# Patient Record
Sex: Female | Born: 1935 | Race: Black or African American | Hispanic: No | Smoking: Former smoker
Health system: Southern US, Community
[De-identification: ages and names within clinical notes are randomized; demographics above are authoritative.]

## PROBLEM LIST (undated history)

## (undated) ENCOUNTER — Emergency Department (HOSPITAL_BASED_OUTPATIENT_CLINIC_OR_DEPARTMENT_OTHER): Admission: EM | Payer: Medicare HMO | Source: Home / Self Care

## (undated) DIAGNOSIS — F0390 Unspecified dementia without behavioral disturbance: Secondary | ICD-10-CM

## (undated) DIAGNOSIS — E7800 Pure hypercholesterolemia, unspecified: Secondary | ICD-10-CM

## (undated) DIAGNOSIS — I2510 Atherosclerotic heart disease of native coronary artery without angina pectoris: Secondary | ICD-10-CM

## (undated) DIAGNOSIS — J40 Bronchitis, not specified as acute or chronic: Secondary | ICD-10-CM

## (undated) DIAGNOSIS — E785 Hyperlipidemia, unspecified: Secondary | ICD-10-CM

## (undated) DIAGNOSIS — I10 Essential (primary) hypertension: Secondary | ICD-10-CM

## (undated) DIAGNOSIS — M1990 Unspecified osteoarthritis, unspecified site: Secondary | ICD-10-CM

## (undated) DIAGNOSIS — K219 Gastro-esophageal reflux disease without esophagitis: Secondary | ICD-10-CM

## (undated) HISTORY — PX: ABDOMINAL HYSTERECTOMY: SHX81

## (undated) HISTORY — PX: VAGINAL HYSTERECTOMY: SHX2639

---

## 2008-06-14 ENCOUNTER — Emergency Department (HOSPITAL_BASED_OUTPATIENT_CLINIC_OR_DEPARTMENT_OTHER): Admission: EM | Admit: 2008-06-14 | Discharge: 2008-06-14 | Payer: Self-pay | Admitting: Emergency Medicine

## 2009-03-17 ENCOUNTER — Emergency Department (HOSPITAL_BASED_OUTPATIENT_CLINIC_OR_DEPARTMENT_OTHER): Admission: EM | Admit: 2009-03-17 | Discharge: 2009-03-17 | Payer: Self-pay | Admitting: Emergency Medicine

## 2009-03-17 ENCOUNTER — Ambulatory Visit: Payer: Self-pay | Admitting: Diagnostic Radiology

## 2011-06-30 ENCOUNTER — Emergency Department (HOSPITAL_BASED_OUTPATIENT_CLINIC_OR_DEPARTMENT_OTHER)
Admission: EM | Admit: 2011-06-30 | Discharge: 2011-07-01 | Disposition: A | Discharge status: Home / Self Care | Payer: No Typology Code available for payment source | Attending: Emergency Medicine | Admitting: Emergency Medicine

## 2011-06-30 ENCOUNTER — Encounter (HOSPITAL_BASED_OUTPATIENT_CLINIC_OR_DEPARTMENT_OTHER): Payer: Self-pay

## 2011-06-30 DIAGNOSIS — E785 Hyperlipidemia, unspecified: Secondary | ICD-10-CM | POA: Insufficient documentation

## 2011-06-30 DIAGNOSIS — K219 Gastro-esophageal reflux disease without esophagitis: Secondary | ICD-10-CM | POA: Insufficient documentation

## 2011-06-30 DIAGNOSIS — I10 Essential (primary) hypertension: Secondary | ICD-10-CM | POA: Insufficient documentation

## 2011-06-30 DIAGNOSIS — I2510 Atherosclerotic heart disease of native coronary artery without angina pectoris: Secondary | ICD-10-CM | POA: Insufficient documentation

## 2011-06-30 HISTORY — DX: Bronchitis, not specified as acute or chronic: J40

## 2011-06-30 HISTORY — DX: Gastro-esophageal reflux disease without esophagitis: K21.9

## 2011-06-30 HISTORY — DX: Essential (primary) hypertension: I10

## 2011-06-30 HISTORY — DX: Atherosclerotic heart disease of native coronary artery without angina pectoris: I25.10

## 2011-06-30 HISTORY — DX: Hyperlipidemia, unspecified: E78.5

## 2011-06-30 NOTE — ED Notes (Signed)
Pt states that she has been experiencing high blood pressure readings at home.  Pt states that she had a catheterization on her heart about a week ago.  No other symptoms just blood pressure check.

## 2011-07-01 NOTE — ED Provider Notes (Signed)
History    Scribed for Hanley Seamen, MD, the patient was seen in room MH01/MH01. This chart was scribed by Katha Cabal.   CSN: [id number]  Arrival date & time 06/30/11  2104   First MD Initiated Contact with Patient 07/01/11 0045      Chief Complaint  Patient presents with  . Hypertension    (Consider location/radiation/quality/duration/timing/severity/associated sxs/prior treatment) HPI   Perline Percifield is a 78 y.o. female with hypertension presents to the Emergency Department complaining persistent of elevated blood pressure since after heart cath.  Patient takes Lisinopril and was placed on Lasix today by PMD.  Episode is gradually improving.  BP at home was 207/92.  Patient had heart catheterization about a week ago.  There has been no difficulty breathing or urinary retention.     Past Medical History  Diagnosis Date  . Hypertension   . Hyperlipidemia   . Acid reflux   . Bronchitis   . Coronary artery disease     Past Surgical History  Procedure Date  . Abdominal hysterectomy   . Cardiac catheterization     History reviewed. No pertinent family history.  History  Substance Use Topics  . Smoking status: Never Smoker   . Smokeless tobacco: Never Used  . Alcohol Use: No    OB History    Grav Para Term Preterm Abortions TAB SAB Ect Mult Living                  Review of Systems  All other systems reviewed and are negative.    Allergies  Review of patient's allergies indicates no known allergies.  Home Medications  No current outpatient prescriptions on file.  BP 156/83  Pulse 58  Temp(Src) 97.9 F (36.6 C) (Oral)  Resp 20  Ht 5\' 4"  (1.626 m)  Wt 141 lb (63.957 kg)  BMI 24.20 kg/m2  SpO2 99%  Physical Exam  General: Well-developed, well-nourished female in no acute distress; appearance consistent with age of record HENT: normocephalic, atraumatic Eyes: pupils reactive to light; extraocular muscles intact, Irregular left pupil with lens  implant, archen senalis , Neck: supple Heart: regular rate and rhythm; no rubs or gallops,   systolic murmur (2/6) at right upper sternum border Lungs: clear to auscultation bilaterally Abdomen: soft; nondistended; nontender; no masses or hepatosplenomegaly; bowel sounds present Extremities: No deformity; full range of motion; pulses normal, good pulses, no edema Neurologic: Awake, alert and oriented; motor function intact in all extremities and symmetric; no facial droop Skin: Warm and dry Psychiatric: Normal mood and affect     ED Course  Procedures (including critical care time)   DIAGNOSTIC STUDIES: Oxygen Saturation is 99% on room air, normal by my interpretation.     COORDINATION OF CARE: 12:52 AM  Physical exam complete.  Reviewed vital signs.  BP 156/83.     LABS / RADIOLOGY:   Labs Reviewed - No data to display No results found.       MDM  I personally performed the services described in this documentation, which was scribed in my presence.  The recorded information has been reviewed and considered.  Patient reassured. Advised that worrying about her blood pressure is known to cause blood pressure to increase. She should take her blood pressure regularly and long it but not dwell on it. She was advised that should she become symptomatic, such as chest pain, dyspnea or neurologic changes she should seek evaluation.    IMPRESSION: 1. Hypertension  Carlisle Beers Murlin Schrieber, MD 07/01/11 0100

## 2011-07-27 ENCOUNTER — Encounter (INDEPENDENT_AMBULATORY_CARE_PROVIDER_SITE_OTHER): Payer: Medicare HMO

## 2011-07-27 DIAGNOSIS — R030 Elevated blood-pressure reading, without diagnosis of hypertension: Secondary | ICD-10-CM

## 2011-09-28 ENCOUNTER — Emergency Department (INDEPENDENT_AMBULATORY_CARE_PROVIDER_SITE_OTHER): Payer: Medicare HMO

## 2011-09-28 ENCOUNTER — Encounter (HOSPITAL_BASED_OUTPATIENT_CLINIC_OR_DEPARTMENT_OTHER): Payer: Self-pay | Admitting: *Deleted

## 2011-09-28 ENCOUNTER — Emergency Department (HOSPITAL_BASED_OUTPATIENT_CLINIC_OR_DEPARTMENT_OTHER)
Admission: EM | Admit: 2011-09-28 | Discharge: 2011-09-29 | Disposition: A | Discharge status: Home / Self Care | Payer: Medicare HMO | Attending: Emergency Medicine | Admitting: Emergency Medicine

## 2011-09-28 DIAGNOSIS — M19079 Primary osteoarthritis, unspecified ankle and foot: Secondary | ICD-10-CM

## 2011-09-28 DIAGNOSIS — M79609 Pain in unspecified limb: Secondary | ICD-10-CM

## 2011-09-28 DIAGNOSIS — S93409A Sprain of unspecified ligament of unspecified ankle, initial encounter: Secondary | ICD-10-CM

## 2011-09-28 DIAGNOSIS — M129 Arthropathy, unspecified: Secondary | ICD-10-CM | POA: Insufficient documentation

## 2011-09-28 DIAGNOSIS — W19XXXA Unspecified fall, initial encounter: Secondary | ICD-10-CM

## 2011-09-28 DIAGNOSIS — M25579 Pain in unspecified ankle and joints of unspecified foot: Secondary | ICD-10-CM

## 2011-09-28 DIAGNOSIS — I10 Essential (primary) hypertension: Secondary | ICD-10-CM | POA: Insufficient documentation

## 2011-09-28 DIAGNOSIS — S92919A Unspecified fracture of unspecified toe(s), initial encounter for closed fracture: Secondary | ICD-10-CM

## 2011-09-28 DIAGNOSIS — W108XXA Fall (on) (from) other stairs and steps, initial encounter: Secondary | ICD-10-CM | POA: Insufficient documentation

## 2011-09-28 DIAGNOSIS — T148XXA Other injury of unspecified body region, initial encounter: Secondary | ICD-10-CM

## 2011-09-28 DIAGNOSIS — E7800 Pure hypercholesterolemia, unspecified: Secondary | ICD-10-CM | POA: Insufficient documentation

## 2011-09-28 DIAGNOSIS — Z79899 Other long term (current) drug therapy: Secondary | ICD-10-CM | POA: Insufficient documentation

## 2011-09-28 HISTORY — DX: Pure hypercholesterolemia, unspecified: E78.00

## 2011-09-28 HISTORY — DX: Unspecified osteoarthritis, unspecified site: M19.90

## 2011-09-28 MED ORDER — OXYCODONE-ACETAMINOPHEN 5-325 MG PO TABS
1.0000 | ORAL_TABLET | Freq: Once | ORAL | Status: AC
  Administered 2011-09-28: 1 via ORAL
  Filled 2011-09-28: qty 1

## 2011-09-28 NOTE — ED Notes (Signed)
Pt playing with her 5yr old granddaughter and fell down 7 stairs. Pt denies LOC or hitting her head. Pt complaining of bilateral ankle and bilateral foot pain.

## 2011-09-28 NOTE — ED Provider Notes (Addendum)
History     CSN: [id number]  Arrival date & time 09/28/11  2204   First MD Initiated Contact with Patient 09/28/11 2209      Chief Complaint  Patient presents with  . Fall    (Consider location/radiation/quality/duration/timing/severity/associated sxs/prior treatment) Patient is a 78 y.o. female presenting with fall.  Fall   patient fell approximately one hour prior to admission down 7 stairs. She is complaining of pain in her left ankle and foot and her right fourth toe. She has an abrasion to the palm of her left hand. She denies striking her head or having any other injuries. She denies any loss of consciousness. She is not on any blood thinners. The pain is sharp and severe. It increases with walking. There is no radiation. Has been present constantly since the fall.  Past Medical History  Diagnosis Date  . Hypertension   . Hypercholesteremia   . Arthritis     Past Surgical History  Procedure Date  . Vaginal hysterectomy     History reviewed. No pertinent family history.  History  Substance Use Topics  . Smoking status: Former Smoker -- 0.2 packs/day for 1 years    Types: Cigarettes    Quit date: 09/27/1961  . Smokeless tobacco: Never Used  . Alcohol Use: No    OB History    Grav Para Term Preterm Abortions TAB SAB Ect Mult Living   4 4 4              Review of Systems  All other systems reviewed and are negative.    Allergies  Ivp dye  Home Medications   Current Outpatient Rx  Name Route Sig Dispense Refill  . AMLODIPINE BESYLATE 10 MG PO TABS Oral Take 10 mg by mouth daily.    . ASPIRIN 81 MG PO TABS Oral Take 81 mg by mouth daily.    Marland Kitchen LIPITOR PO Oral Take 1 tablet by mouth at bedtime.    . FUROSEMIDE 20 MG PO TABS Oral Take 20 mg by mouth 2 (two) times daily.    Marland Kitchen HYDRALAZINE HCL 10 MG PO TABS Oral Take 10 mg by mouth 3 (three) times daily.    Marland Kitchen LISINOPRIL 40 MG PO TABS Oral Take 40 mg by mouth daily.      BP 128/55  Pulse 80  Temp(Src)  97.9 F (36.6 C) (Oral)  Resp 16  Ht 5\' 5"  (1.651 m)  Wt 150 lb (68.04 kg)  BMI 24.96 kg/m2  SpO2 96%  Physical Exam  Nursing note and vitals reviewed. Constitutional: She appears well-developed and well-nourished.  HENT:  Head: Normocephalic and atraumatic.  Eyes: Conjunctivae and EOM are normal. Pupils are equal, round, and reactive to light.  Neck: Normal range of motion. Neck supple.  Cardiovascular: Normal rate and regular rhythm.   Pulmonary/Chest: Effort normal.  Abdominal: Soft. Bowel sounds are normal.  Genitourinary: Guaiac stool: right fourth toe swollen and diffusely tender. No open skin noted.  Musculoskeletal:       Remainder of fluid on right is normal. Left foot is tender over the base of the fifth metatarsal. Left ankle is tender laterally. There is full range of motion of both knees and hips. There is no tenderness palpated over the entire spine. Upper extremities appear normal with the exception of an abrasion to the base of the left palm.  Neurological: She is alert.  Skin: Skin is warm and dry.  Psychiatric: She has a normal mood and affect.  ED Course  Procedures (including critical care time)  Labs Reviewed - No data to display Dg Ankle Complete Left  09/28/2011  *RADIOLOGY REPORT*  Clinical Data: Bilateral foot and ankle pain after fall  LEFT ANKLE COMPLETE - 3+ VIEW  Comparison: None.  Findings: No evidence of acute fracture or subluxation of the left ankle.  The ankle mortis and talar dome appear intact.  No focal bone lesion or bone destruction.  Bone cortex and trabecular architecture appear intact.  No abnormal periosteal reaction. Vascular calcifications.  Degenerative changes in the tarsal bones. Small Achilles calcaneal spur.  IMPRESSION: No acute bony abnormalities.  Original Report Authenticated By: Marlon Pel, M.D.   Dg Foot Complete Left  09/28/2011  *RADIOLOGY REPORT*  Clinical Data: Fall.  Pain.  LEFT FOOT - COMPLETE 3+ VIEW   Comparison: None.  Findings: No acute bony or joint abnormality is identified.  First MTP degenerative change is noted.  Soft tissue structures are unremarkable.  IMPRESSION: No acute finding.  First MTP osteoarthritis.  Original Report Authenticated By: Bernadene Bell. D'ALESSIO, M.D.   Dg Foot Complete Right  09/28/2011  *RADIOLOGY REPORT*  Clinical Data: Bilateral foot and ankle pain after fall  RIGHT FOOT COMPLETE - 3+ VIEW  Comparison: None.  Findings: Acute transverse fracture of the proximal shaft of the proximal phalanx of the right fourth toe.  Mild medial displacement and lateral angulation of the distal fracture fragment.  No other acute fractures visualized.  Degenerative changes in the first metatarsal-phalangeal joint.  No focal bone lesion or bone destruction.  No abnormal periosteal reaction.  Vascular calcifications.  IMPRESSION: Acute fracture of the proximal phalanx of the right fourth toe. Degenerative changes.  Original Report Authenticated By: Marlon Pel, M.D.     No diagnosis found.   MDM  Plan Buddy taping of right fourth toe with third toe or fifth toe. She will have a post op shoe placed on this side. She will have her device on left ankle. She'll be placed on crutches to put weight onto the right foot. She is given pain medicine. She is advised to follow with her primary care Dr.     Roosvelt Harps rx for walker- discussed with patient and daughter.   Hilario Quarry, MD 09/28/11 0454  Hilario Quarry, MD 09/28/11 [phone]

## 2011-09-28 NOTE — ED Notes (Signed)
Dr Ray at bedside. 

## 2011-09-28 NOTE — Discharge Instructions (Signed)
Ankle Sprain An ankle sprain is an injury to the strong, fibrous tissues (ligaments) that hold the bones of your ankle joint together.  CAUSES Ankle sprain usually is caused by a fall or by twisting your ankle. People who participate in sports are more prone to these types of injuries.  SYMPTOMS  Symptoms of ankle sprain include:  Pain in your ankle. The pain may be present at rest or only when you are trying to stand or walk.   Swelling.   Bruising. Bruising may develop immediately or within 1 to 2 days after your injury.   Difficulty standing or walking.  DIAGNOSIS  Your caregiver will ask you details about your injury and perform a physical exam of your ankle to determine if you have an ankle sprain. During the physical exam, your caregiver will press and squeeze specific areas of your foot and ankle. Your caregiver will try to move your ankle in certain ways. An X-Sagal Gayton exam may be done to be sure a bone was not broken or a ligament did not separate from one of the bones in your ankle (avulsion).  TREATMENT  Certain types of braces can help stabilize your ankle. Your caregiver can make a recommendation for this. Your caregiver may recommend the use of medication for pain. If your sprain is severe, your caregiver may refer you to a surgeon who helps to restore function to parts of your skeletal system (orthopedist) or a physical therapist. HOME CARE INSTRUCTIONS  Apply ice to your injury for 1 to 2 days or as directed by your caregiver. Applying ice helps to reduce inflammation and pain.  Put ice in a plastic bag.   Place a towel between your skin and the bag.   Leave the ice on for 15 to 20 minutes at a time, every 2 hours while you are awake.   Take over-the-counter or prescription medicines for pain, discomfort, or fever only as directed by your caregiver.   Keep your injured leg elevated, when possible, to lessen swelling.   If your caregiver recommends crutches, use them as  instructed. Gradually, put weight on the affected ankle. Continue to use crutches or a cane until you can walk without feeling pain in your ankle.   If you have a plaster splint, wear the splint as directed by your caregiver. Do not rest it on anything harder than a pillow the first 24 hours. Do not put weight on it. Do not get it wet. You may take it off to take a shower or bath.   You may have been given an elastic bandage to wear around your ankle to provide support. If the elastic bandage is too tight (you have numbness or tingling in your foot or your foot becomes cold and blue), adjust the bandage to make it comfortable.   If you have an air splint, you may blow more air into it or let air out to make it more comfortable. You may take your splint off at night and before taking a shower or bath.   Wiggle your toes in the splint several times per day if you are able.  SEEK MEDICAL CARE IF:   You have an increase in bruising, swelling, or pain.   Your toes feel cold.   Pain relief is not achieved with medication.  SEEK IMMEDIATE MEDICAL CARE IF: Your toes are numb or blue or you have severe pain. MAKE SURE YOU:   Understand these instructions.   Will watch your condition.     Will get help right away if you are not doing well or get worse.  Document Released: 05/07/2005 Document Revised: 04/26/2011 Document Reviewed: 12/10/2007 [facility] Patient Information 2012 [place], [place].Toe Fracture Your caregiver has diagnosed you as having a fractured toe. A toe fracture is a break in the bone of a toe. "Buddy taping" is a way of splinting your broken toe, by taping the broken toe to the toe next to it. This "buddy taping" will keep the injured toe from moving beyond normal range of motion. Buddy taping also helps the toe heal in a more normal alignment. It may take 6 to 8 weeks for the toe injury to heal. HOME CARE INSTRUCTIONS   Leave your toes taped together for as long as directed by your  caregiver or until you see a doctor for a follow-up examination. You can change the tape after bathing. Always use a small piece of gauze or cotton between the toes when taping them together. This will help the skin stay dry and prevent infection.   Apply ice to the injury for 15 to 20 minutes each hour while awake for the first 2 days. Put the ice in a plastic bag and place a towel between the bag of ice and your skin.   After the first 2 days, apply heat to the injured area. Use heat for the next 2 to 3 days. Place a heating pad on the foot or soak the foot in warm water as directed by your caregiver.   Keep your foot elevated as much as possible to lessen swelling.   Wear sturdy, supportive shoes. The shoes should not pinch the toes or fit tightly against the toes.   Your caregiver may prescribe a rigid shoe if your foot is very swollen.   Your may be given crutches if the pain is too great and it hurts too much to walk.   Only take over-the-counter or prescription medicines for pain, discomfort, or fever as directed by your caregiver.   If your caregiver has given you a follow-up appointment, it is very important to keep that appointment. Not keeping the appointment could result in a chronic or permanent injury, pain, and disability. If there is any problem keeping the appointment, you must call back to this facility for assistance.  SEEK MEDICAL CARE IF:   You have increased pain or swelling, not relieved with medications.   The pain does not get better after 1 week.   Your injured toe is cold when the others are warm.  SEEK IMMEDIATE MEDICAL CARE IF:   The toe becomes cold, numb, or white.   The toe becomes hot (inflamed) and red.  Document Released: 05/04/2000 Document Revised: 04/26/2011 Document Reviewed: 12/22/2007 [facility] [place] Patient Information 2012 [place], [place].

## 2012-01-18 ENCOUNTER — Encounter (HOSPITAL_BASED_OUTPATIENT_CLINIC_OR_DEPARTMENT_OTHER): Payer: Self-pay | Admitting: *Deleted

## 2012-01-18 ENCOUNTER — Emergency Department (HOSPITAL_BASED_OUTPATIENT_CLINIC_OR_DEPARTMENT_OTHER): Payer: Medicare HMO

## 2012-01-18 ENCOUNTER — Emergency Department (HOSPITAL_BASED_OUTPATIENT_CLINIC_OR_DEPARTMENT_OTHER)
Admission: EM | Admit: 2012-01-18 | Discharge: 2012-01-18 | Disposition: A | Discharge status: Home / Self Care | Payer: Medicare HMO | Attending: Emergency Medicine | Admitting: Emergency Medicine

## 2012-01-18 DIAGNOSIS — S838X9A Sprain of other specified parts of unspecified knee, initial encounter: Secondary | ICD-10-CM | POA: Insufficient documentation

## 2012-01-18 DIAGNOSIS — Z7982 Long term (current) use of aspirin: Secondary | ICD-10-CM | POA: Insufficient documentation

## 2012-01-18 DIAGNOSIS — Z86718 Personal history of other venous thrombosis and embolism: Secondary | ICD-10-CM | POA: Insufficient documentation

## 2012-01-18 DIAGNOSIS — S86819A Strain of other muscle(s) and tendon(s) at lower leg level, unspecified leg, initial encounter: Secondary | ICD-10-CM | POA: Insufficient documentation

## 2012-01-18 DIAGNOSIS — Z87891 Personal history of nicotine dependence: Secondary | ICD-10-CM | POA: Insufficient documentation

## 2012-01-18 DIAGNOSIS — M129 Arthropathy, unspecified: Secondary | ICD-10-CM | POA: Insufficient documentation

## 2012-01-18 DIAGNOSIS — W108XXA Fall (on) (from) other stairs and steps, initial encounter: Secondary | ICD-10-CM | POA: Insufficient documentation

## 2012-01-18 DIAGNOSIS — E7800 Pure hypercholesterolemia, unspecified: Secondary | ICD-10-CM | POA: Insufficient documentation

## 2012-01-18 DIAGNOSIS — S86119A Strain of other muscle(s) and tendon(s) of posterior muscle group at lower leg level, unspecified leg, initial encounter: Secondary | ICD-10-CM

## 2012-01-18 DIAGNOSIS — I10 Essential (primary) hypertension: Secondary | ICD-10-CM | POA: Insufficient documentation

## 2012-01-18 NOTE — ED Notes (Signed)
Pain in her left lower leg for a few months. No known injury. No swelling or redness noted. Ambulatory without difficulty.

## 2012-01-18 NOTE — ED Provider Notes (Signed)
History     CSN: [id number]  Arrival date & time 01/18/12  1422   First MD Initiated Contact with Patient 01/18/12 1454      Chief Complaint  Patient presents with  . Leg Pain    (Consider location/radiation/quality/duration/timing/severity/associated sxs/prior treatment) HPI Patient is a 78 year old female who presents today complaining of left calf pain that she rates as being mild and aching that is single and on for the past month. Apparently a month ago the patient had a fall down 5 or 6 steps and at that time had a broken right toe as well as the left ankle sprain. She denies any other trauma and said that since that time she's developed discomfort in her left calf. This is isolated not bilateral. Patient has seen her primary care physician for this he feels that it is a muscle pull. Patient does note that she has stopped wearing shoes with 1-2 inch heels and has begun wearing flat shoes. She got this may be the reason for the pain but admits that it is only in the left leg. She does have a history of blood clots and is on aspirin but no other anticoagulation. Patient has no history of cancer. She does not know the reason for her previous blood clot. Pain is worse with ambulation better with the tramadol she has been prescribed. There no other associated modifying factors. Past Medical History  Diagnosis Date  . Hypertension   . Hypercholesteremia   . Arthritis     Past Surgical History  Procedure Date  . Vaginal hysterectomy     History reviewed. No pertinent family history.  History  Substance Use Topics  . Smoking status: Former Smoker -- 0.2 packs/day for 1 years    Types: Cigarettes    Quit date: 09/27/1961  . Smokeless tobacco: Never Used  . Alcohol Use: No    OB History    Grav Para Term Preterm Abortions TAB SAB Ect Mult Living   4 4 4              Review of Systems  Constitutional: Negative.   HENT: Negative.   Eyes: Negative.   Respiratory: Negative.     Cardiovascular: Negative.   Gastrointestinal: Negative.   Genitourinary: Negative.   Musculoskeletal:       Left leg pain  Skin: Negative.   Neurological: Negative.   Hematological: Negative.   Psychiatric/Behavioral: Negative.   All other systems reviewed and are negative.    Allergies  Ivp dye  Home Medications   Current Outpatient Rx  Name Route Sig Dispense Refill  . AMLODIPINE BESYLATE 10 MG PO TABS Oral Take 10 mg by mouth daily.    . ASPIRIN 81 MG PO TABS Oral Take 81 mg by mouth daily.    . ATORVASTATIN CALCIUM 20 MG PO TABS Oral Take 20 mg by mouth daily.    . FUROSEMIDE 20 MG PO TABS Oral Take 20 mg by mouth 2 (two) times daily.    Marland Kitchen HYDRALAZINE HCL 10 MG PO TABS Oral Take 10 mg by mouth 3 (three) times daily.    Marland Kitchen LISINOPRIL 40 MG PO TABS Oral Take 40 mg by mouth daily.      BP 132/75  Pulse 63  Temp 98.6 F (37 C) (Oral)  Resp 20  SpO2 99%  Physical Exam  Nursing note and vitals reviewed. GEN: Well-developed, well-nourished female in no distress HEENT: Atraumatic, normocephalic. Oropharynx clear without erythema EYES: PERRLA BL, no scleral icterus. NECK:  Trachea midline, no meningismus CV: regular rate and rhythm. No murmurs, rubs, or gallops PULM: No respiratory distress.  No crackles, wheezes, or rales. GI: soft, non-tender. No guarding, rebound, or tenderness. + bowel sounds  GU: deferred Neuro: cranial nerves grossly 2-12 intact, no abnormalities of strength or sensation, A and O x 3 MSK: Patient moves all 4 extremities symmetrically, no deformity, edema, or injury noted. Patient with tenderness to palpation of the left calf no palpable cord. Pulse is intact distally. Skin: No rashes petechiae, purpura, or jaundice Psych: no abnormality of mood   ED Course  Procedures (including critical care time)  Labs Reviewed - No data to display US Venous Img Lower Unilateral Left  01/18/2012  *RADIOLOGY REPORT*  Clinical Data: Left calf pain.  Fall 2  months ago.  History of pulmonary embolus.  LEFT LOWER EXTREMITY VENOUS DUPLEX ULTRASOUND  Technique: Gray-scale sonography with graded compression, as well as color Doppler and duplex ultrasound, were performed to evaluate the deep venous system of the lower extremity on the left side from the level of the common femoral vein through the popliteal and proximal calf veins. Spectral Doppler was utilized to evaluate flow at rest and with distal augmentation maneuvers.  Comparison: None  Findings:  Normal compressibility of the left common femoral, superficial femoral, and popliteal veins is demonstrated, as well as the visualized proximal calf veins.  No filling defects to suggest DVT on grayscale or color Doppler imaging.  Doppler waveforms show normal direction of venous flow, normal respiratory phasicity and response to augmentation.  IMPRESSION: 1.  No evidence of lower extremity deep vein thrombosis.   Original Report Authenticated By: Dellia Cloud, M.D.      1. Gastrocnemius strain       MDM  Patient was evaluated by myself. Based on evaluation I have low suspicion for blood clot. However, patient had history of prior thromboembolic event and is not currently anticoagulated. Patient and I discussed this and she preferred to have ultrasound today. This was ordered. If this returns negative I do suspect the patient may have slight muscle pull. She is comfortable with her current pain control with tramadol and admits that she was here today as she was worried there might be something worse going on that was being missed by assuming that this was a muscle pull.  4:26 PM Ultrasound returns negative. Patient was notified. She can continue to take her home tramadol and followup as previously scheduled with her primary care physician. She was discharged in good condition.        Cyndra Numbers, MD 01/18/12 [phone]

## 2012-08-23 ENCOUNTER — Encounter (HOSPITAL_BASED_OUTPATIENT_CLINIC_OR_DEPARTMENT_OTHER): Payer: Self-pay | Admitting: *Deleted

## 2012-08-23 ENCOUNTER — Emergency Department (HOSPITAL_BASED_OUTPATIENT_CLINIC_OR_DEPARTMENT_OTHER)
Admission: EM | Admit: 2012-08-23 | Discharge: 2012-08-23 | Disposition: A | Discharge status: Home / Self Care | Payer: Medicare HMO | Attending: Emergency Medicine | Admitting: Emergency Medicine

## 2012-08-23 DIAGNOSIS — M129 Arthropathy, unspecified: Secondary | ICD-10-CM | POA: Insufficient documentation

## 2012-08-23 DIAGNOSIS — R0600 Dyspnea, unspecified: Secondary | ICD-10-CM

## 2012-08-23 DIAGNOSIS — Z87891 Personal history of nicotine dependence: Secondary | ICD-10-CM | POA: Insufficient documentation

## 2012-08-23 DIAGNOSIS — R0609 Other forms of dyspnea: Secondary | ICD-10-CM | POA: Insufficient documentation

## 2012-08-23 DIAGNOSIS — R0989 Other specified symptoms and signs involving the circulatory and respiratory systems: Secondary | ICD-10-CM | POA: Insufficient documentation

## 2012-08-23 DIAGNOSIS — Z7982 Long term (current) use of aspirin: Secondary | ICD-10-CM | POA: Insufficient documentation

## 2012-08-23 DIAGNOSIS — E7800 Pure hypercholesterolemia, unspecified: Secondary | ICD-10-CM | POA: Insufficient documentation

## 2012-08-23 DIAGNOSIS — Z79899 Other long term (current) drug therapy: Secondary | ICD-10-CM | POA: Insufficient documentation

## 2012-08-23 DIAGNOSIS — I10 Essential (primary) hypertension: Secondary | ICD-10-CM | POA: Insufficient documentation

## 2012-08-23 DIAGNOSIS — Z8709 Personal history of other diseases of the respiratory system: Secondary | ICD-10-CM | POA: Insufficient documentation

## 2012-08-23 DIAGNOSIS — R002 Palpitations: Secondary | ICD-10-CM | POA: Insufficient documentation

## 2012-08-23 MED ORDER — LEVALBUTEROL TARTRATE 45 MCG/ACT IN AERO: 1.0000 | INHALATION_SPRAY | RESPIRATORY_TRACT | Status: DC | PRN

## 2012-08-23 NOTE — ED Notes (Signed)
Pt sattes she saw the Dr. On Friday and was dx'd with bronchitis. Has been sick x 2 weeks. Sarah, RRT to Triage to assess.

## 2012-08-23 NOTE — ED Provider Notes (Signed)
History    This chart was scribed for Krista B. Bernette Mayers, MD scribed by Magnus Sinning. The patient was seen in room MH12/MH12 at 21:53   CSN: [id number]  Arrival date & time 08/23/12  2038     Chief Complaint  Patient presents with  . Shortness of Breath    (Consider location/radiation/quality/duration/timing/severity/associated sxs/prior treatment) Patient is a 79 y.o. female presenting with shortness of breath. The history is provided by the patient. No language interpreter was used.  Shortness of Breath Associated symptoms: no chest pain, no cough and no fever    Krista Joseph is a 79 y.o. female who presents to the Emergency Department complaining of constant moderate SOB, onset one month ago, but worsening all day today. She says she went to her doctor a week ago and again two days ago. She says at recent visit she was rechecked for SOB and informed to stop taking previously prescribed QVAR inhaler due to heart palpitations. She says the SOB has improved since initial onset, two weeks ago and reports it only worsens during activity. She says at first visit with PCP they suspected she had a bronchitis, and was given this QVAR inhaler. Despite instructions to stop QVAR, she says she has used it today with minimal temporary relief.  She denies coughing or worsening SOB when laying down in bed at night.  The patient states that that she has never been seen by a lung specialist.   Past Medical History  Diagnosis Date  . Hypertension   . Hypercholesteremia   . Arthritis   . Bronchitis     Past Surgical History  Procedure Laterality Date  . Vaginal hysterectomy      History reviewed. No pertinent family history.  History  Substance Use Topics  . Smoking status: Former Smoker -- 0.25 packs/day for 1 years    Types: Cigarettes    Quit date: 09/27/1961  . Smokeless tobacco: Never Used  . Alcohol Use: No    OB History   Grav Para Term Preterm Abortions TAB SAB Ect Mult  Living   4 4 4              Review of Systems  Constitutional: Negative for fever.  Respiratory: Positive for shortness of breath. Negative for cough.   Cardiovascular: Positive for palpitations. Negative for chest pain and leg swelling.  All other systems reviewed and are negative.    Allergies  Ivp dye  Home Medications   Current Outpatient Rx  Name  Route  Sig  Dispense  Refill  . amLODipine (NORVASC) 10 MG tablet   Oral   Take 10 mg by mouth daily.         Marland Kitchen aspirin 81 MG tablet   Oral   Take 81 mg by mouth daily.         Marland Kitchen atorvastatin (LIPITOR) 20 MG tablet   Oral   Take 20 mg by mouth daily.         . furosemide (LASIX) 20 MG tablet   Oral   Take 20 mg by mouth 2 (two) times daily.         . hydrALAZINE (APRESOLINE) 10 MG tablet   Oral   Take 10 mg by mouth 3 (three) times daily.         Marland Kitchen lisinopril (PRINIVIL,ZESTRIL) 40 MG tablet   Oral   Take 40 mg by mouth daily.           BP 145/73  Pulse 66  Temp(Src) 98.6 F (37 C) (Oral)  Resp 20  Ht 5\' 4"  (1.626 m)  Wt 136 lb (61.689 kg)  BMI 23.33 kg/m2  SpO2 96%  Physical Exam  Nursing note and vitals reviewed. Constitutional: She is oriented to person, place, and time. She appears well-developed and well-nourished.  HENT:  Head: Normocephalic and atraumatic.  Eyes: EOM are normal. Pupils are equal, round, and reactive to light.  Neck: Normal range of motion. Neck supple.  Cardiovascular: Normal rate, normal heart sounds and intact distal pulses.   Pulmonary/Chest: Effort normal and breath sounds normal.  Abdominal: Bowel sounds are normal. She exhibits no distension. There is no tenderness.  Musculoskeletal: Normal range of motion. She exhibits no edema and no tenderness.  Neurological: She is alert and oriented to person, place, and time. She has normal strength. No cranial nerve deficit or sensory deficit.  Skin: Skin is warm and dry. No rash noted.  Psychiatric: She has a normal  mood and affect.    ED Course  Procedures (including critical care time) DIAGNOSTIC STUDIES: Oxygen Saturation is 96% on room air, normal by my interpretation.    COORDINATION OF CARE:  Labs Reviewed - No data to display No results found.   1. Dyspnea       MDM  Pt with history of bronchitis has had about a month of intermittent dyspnea, has had evaluation by cardiology (negative) and PCP who gave her QVAR which has helped her symptoms but causes her heart to race. She was having symptoms earlier today so came here for eval. Feeling fine now. No objective signs of dyspnea. Normal exam. Daughter at bedside states she has had extensive normal evaluation recently. No indication for further ED workup. Will give Rx for Xopanex inhaler as this may have less incidence of tachycardia although she was cautioned that this may not be true in all cases. Advised PCP follow up for recheck if not improving.   I personally performed the services described in this documentation, which was scribed in my presence. The recorded information has been reviewed and is accurate.          Krista B. Bernette Mayers, MD 08/23/12 2217

## 2012-08-23 NOTE — ED Notes (Signed)
MD at bedside. 

## 2012-10-20 ENCOUNTER — Emergency Department (HOSPITAL_BASED_OUTPATIENT_CLINIC_OR_DEPARTMENT_OTHER)
Admission: EM | Admit: 2012-10-20 | Discharge: 2012-10-20 | Disposition: A | Discharge status: Home / Self Care | Payer: Medicare HMO | Attending: Emergency Medicine | Admitting: Emergency Medicine

## 2012-10-20 ENCOUNTER — Emergency Department (HOSPITAL_BASED_OUTPATIENT_CLINIC_OR_DEPARTMENT_OTHER): Payer: Medicare HMO

## 2012-10-20 ENCOUNTER — Encounter (HOSPITAL_BASED_OUTPATIENT_CLINIC_OR_DEPARTMENT_OTHER): Payer: Self-pay | Admitting: Student

## 2012-10-20 DIAGNOSIS — I10 Essential (primary) hypertension: Secondary | ICD-10-CM | POA: Insufficient documentation

## 2012-10-20 DIAGNOSIS — E7800 Pure hypercholesterolemia, unspecified: Secondary | ICD-10-CM | POA: Insufficient documentation

## 2012-10-20 DIAGNOSIS — J209 Acute bronchitis, unspecified: Secondary | ICD-10-CM | POA: Insufficient documentation

## 2012-10-20 DIAGNOSIS — Z7982 Long term (current) use of aspirin: Secondary | ICD-10-CM | POA: Insufficient documentation

## 2012-10-20 DIAGNOSIS — Z79899 Other long term (current) drug therapy: Secondary | ICD-10-CM | POA: Insufficient documentation

## 2012-10-20 DIAGNOSIS — Z87891 Personal history of nicotine dependence: Secondary | ICD-10-CM | POA: Insufficient documentation

## 2012-10-20 DIAGNOSIS — Z8739 Personal history of other diseases of the musculoskeletal system and connective tissue: Secondary | ICD-10-CM | POA: Insufficient documentation

## 2012-10-20 MED ORDER — ALBUTEROL SULFATE (5 MG/ML) 0.5% IN NEBU
2.5000 mg | INHALATION_SOLUTION | Freq: Once | RESPIRATORY_TRACT | Status: AC
  Administered 2012-10-20: 2.5 mg via RESPIRATORY_TRACT
  Filled 2012-10-20: qty 0.5

## 2012-10-20 MED ORDER — PREDNISONE 50 MG PO TABS: 50.0000 mg | ORAL_TABLET | Freq: Every day | ORAL | Status: DC

## 2012-10-20 MED ORDER — PREDNISONE 50 MG PO TABS
60.0000 mg | ORAL_TABLET | Freq: Once | ORAL | Status: AC
  Administered 2012-10-20: 60 mg via ORAL
  Filled 2012-10-20: qty 1

## 2012-10-20 MED ORDER — IPRATROPIUM BROMIDE 0.02 % IN SOLN
0.5000 mg | Freq: Once | RESPIRATORY_TRACT | Status: AC
  Administered 2012-10-20: 0.5 mg via RESPIRATORY_TRACT
  Filled 2012-10-20: qty 2.5

## 2012-10-20 NOTE — Discharge Instructions (Signed)
Use your inhalers as needed to help control the coughing. Treat the coughing as if it were wheezing.  Acute Bronchitis You have acute bronchitis. This means you have a chest cold. The airways in your lungs are red and sore (inflamed). Acute means it is sudden onset.  CAUSES Bronchitis is most often caused by the same virus that causes a cold. SYMPTOMS   Body aches.  Chest congestion.  Chills.  Cough.  Fever.  Shortness of breath.  Sore throat. TREATMENT  Acute bronchitis is usually treated with rest, fluids, and medicines for relief of fever or cough. Most symptoms should go away after a few days or a week. Increased fluids may help thin your secretions and will prevent dehydration. Your caregiver may give you an inhaler to improve your symptoms. The inhaler reduces shortness of breath and helps control cough. You can take over-the-counter pain relievers or cough medicine to decrease coughing, pain, or fever. A cool-air vaporizer may help thin bronchial secretions and make it easier to clear your chest. Antibiotics are usually not needed but can be prescribed if you smoke, are seriously ill, have chronic lung problems, are elderly, or you are at higher risk for developing complications.Allergies and asthma can make bronchitis worse. Repeated episodes of bronchitis may cause longstanding lung problems. Avoid smoking and secondhand smoke.Exposure to cigarette smoke or irritating chemicals will make bronchitis worse. If you are a cigarette smoker, consider using nicotine gum or skin patches to help control withdrawal symptoms. Quitting smoking will help your lungs heal faster. Recovery from bronchitis is often slow, but you should start feeling better after 2 to 3 days. Cough from bronchitis frequently lasts for 3 to 4 weeks. To prevent another bout of acute bronchitis:  Quit smoking.  Wash your hands frequently to get rid of viruses or use a hand sanitizer.  Avoid other people with  cold or virus symptoms.  Try not to touch your hands to your mouth, nose, or eyes. SEEK IMMEDIATE MEDICAL CARE IF:  You develop increased fever, chills, or chest pain.  You have severe shortness of breath or bloody sputum.  You develop dehydration, fainting, repeated vomiting, or a severe headache.  You have no improvement after 1 week of treatment or you get worse. MAKE SURE YOU:   Understand these instructions.  Will watch your condition.  Will get help right away if you are not doing well or get worse. Document Released: 06/14/2004 Document Revised: 07/30/2011 Document Reviewed: 08/30/2010 [facility] Patient Information 2014 [place], [place].  Prednisone tablets What is this medicine? PREDNISONE (PRED ni sone) is a corticosteroid. It is commonly used to treat inflammation of the skin, joints, lungs, and other organs. Common conditions treated include asthma, allergies, and arthritis. It is also used for other conditions, such as blood disorders and diseases of the adrenal glands. This medicine may be used for other purposes; ask your health care provider or pharmacist if you have questions. What should I tell my health care provider before I take this medicine? They need to know if you have any of these conditions: -Cushing's syndrome -diabetes -glaucoma -heart disease -high blood pressure -infection (especially a virus infection such as chickenpox, cold sores, or herpes) -kidney disease -liver disease -mental illness -myasthenia gravis -osteoporosis -seizures -stomach or intestine problems -thyroid disease -an unusual or allergic reaction to lactose, prednisone, other medicines, foods, dyes, or preservatives -pregnant or trying to get pregnant -breast-feeding How should I use this medicine? Take this medicine by mouth with a glass of  water. Follow the directions on the prescription label. Take this medicine with food. If you are taking this medicine once a day, take it  in the morning. Do not take more medicine than you are told to take. Do not suddenly stop taking your medicine because you may develop a severe reaction. Your doctor will tell you how much medicine to take. If your doctor wants you to stop the medicine, the dose may be slowly lowered over time to avoid any side effects. Talk to your pediatrician regarding the use of this medicine in children. Special care may be needed. Overdosage: If you think you have taken too much of this medicine contact a poison control center or emergency room at once. NOTE: This medicine is only for you. Do not share this medicine with others. What if I miss a dose? If you miss a dose, take it as soon as you can. If it is almost time for your next dose, talk to your doctor or health care professional. You may need to miss a dose or take an extra dose. Do not take double or extra doses without advice. What may interact with this medicine? Do not take this medicine with any of the following medications: -metyrapone -mifepristone This medicine may also interact with the following medications: -aminoglutethimide -amphotericin B -aspirin and aspirin-like medicines -barbiturates -certain medicines for diabetes, like glipizide or glyburide -cholestyramine -cholinesterase inhibitors -cyclosporine -digoxin -diuretics -ephedrine -female hormones, like estrogens and birth control pills -isoniazid -ketoconazole -NSAIDS, medicines for pain and inflammation, like ibuprofen or naproxen -phenytoin -rifampin -toxoids -vaccines -warfarin This list may not describe all possible interactions. Give your health care provider a list of all the medicines, herbs, non-prescription drugs, or dietary supplements you use. Also tell them if you smoke, drink alcohol, or use illegal drugs. Some items may interact with your medicine. What should I watch for while using this medicine? Visit your doctor or health care professional for regular  checks on your progress. If you are taking this medicine over a prolonged period, carry an identification card with your name and address, the type and dose of your medicine, and your doctor's name and address. This medicine may increase your risk of getting an infection. Tell your doctor or health care professional if you are around anyone with measles or chickenpox, or if you develop sores or blisters that do not heal properly. If you are going to have surgery, tell your doctor or health care professional that you have taken this medicine within the last twelve months. Ask your doctor or health care professional about your diet. You may need to lower the amount of salt you eat. This medicine may affect blood sugar levels. If you have diabetes, check with your doctor or health care professional before you change your diet or the dose of your diabetic medicine. What side effects may I notice from receiving this medicine? Side effects that you should report to your doctor or health care professional as soon as possible: -allergic reactions like skin rash, itching or hives, swelling of the face, lips, or tongue -changes in emotions or moods -changes in vision -depressed mood -eye pain -fever or chills, cough, sore throat, pain or difficulty passing urine -increased thirst -swelling of ankles, feet Side effects that usually do not require medical attention (report to your doctor or health care professional if they continue or are bothersome): -confusion, excitement, restlessness -headache -nausea, vomiting -skin problems, acne, thin and shiny skin -trouble sleeping -weight gain This list may  not describe all possible side effects. Call your doctor for medical advice about side effects. You may report side effects to FDA at 1-800-FDA-1088. Where should I keep my medicine? Keep out of the reach of children. Store at room temperature between 15 and 30 degrees C (59 and 86 degrees F). Protect from  light. Keep container tightly closed. Throw away any unused medicine after the expiration date. NOTE: This sheet is a summary. It may not cover all possible information. If you have questions about this medicine, talk to your doctor, pharmacist, or health care provider.  2012, [employer]. (12/21/2010 10:57:14 AM)

## 2012-10-20 NOTE — ED Provider Notes (Signed)
History     CSN: [id number]  Arrival date & time 10/20/12  1818   First MD Initiated Contact with Patient 10/20/12 1911      Chief Complaint  Patient presents with  . Cough    with associated frontal headache, prior hx of bronchitis    (Consider location/radiation/quality/duration/timing/severity/associated sxs/prior treatment) Patient is a 79 y.o. female presenting with cough. The history is provided by the patient.  Cough She complains of cough with onset yesterday. Cough is productive of a small amount of white sputum. She denies fever, chills, sweats. She denies dyspnea or chest pain. She denies nausea, vomiting, diarrhea. She denies arthralgias or myalgias. She's taking Robitussin-DM with slight improvement. There was a sick contact in that her grandchild not she was exposed to also had a cough.  Past Medical History  Diagnosis Date  . Hypertension   . Hypercholesteremia   . Arthritis   . Bronchitis     Past Surgical History  Procedure Laterality Date  . Vaginal hysterectomy      History reviewed. No pertinent family history.  History  Substance Use Topics  . Smoking status: Former Smoker -- 0.25 packs/day for 1 years    Types: Cigarettes    Quit date: 09/27/1961  . Smokeless tobacco: Never Used  . Alcohol Use: No    OB History   Grav Para Term Preterm Abortions TAB SAB Ect Mult Living   4 4 4              Review of Systems  Respiratory: Positive for cough.   All other systems reviewed and are negative.    Allergies  Ivp dye  Home Medications   Current Outpatient Rx  Name  Route  Sig  Dispense  Refill  . amLODipine (NORVASC) 10 MG tablet   Oral   Take 10 mg by mouth daily.         Marland Kitchen aspirin 81 MG tablet   Oral   Take 81 mg by mouth daily.         Marland Kitchen atorvastatin (LIPITOR) 20 MG tablet   Oral   Take 20 mg by mouth daily.         . furosemide (LASIX) 20 MG tablet   Oral   Take 20 mg by mouth 2 (two) times daily.         .  hydrALAZINE (APRESOLINE) 10 MG tablet   Oral   Take 10 mg by mouth 3 (three) times daily.         Marland Kitchen levalbuterol (XOPENEX HFA) 45 MCG/ACT inhaler   Inhalation   Inhale 1-2 puffs into the lungs every 4 (four) hours as needed for shortness of breath.   1 Inhaler   0   . lisinopril (PRINIVIL,ZESTRIL) 40 MG tablet   Oral   Take 40 mg by mouth daily.           BP 127/60  Pulse 68  Temp(Src) 98.7 F (37.1 C) (Oral)  Resp 18  Ht 5\' 2"  (1.575 m)  Wt 132 lb (59.875 kg)  BMI 24.14 kg/m2  SpO2 98%  Physical Exam  Nursing note and vitals reviewed.  79 year old female, resting comfortably and in no acute distress. Vital signs are normal. Oxygen saturation is 98%, which is normal. Head is normocephalic and atraumatic. PERRLA, EOMI. Oropharynx is clear. Neck is nontender and supple without adenopathy or JVD. Back is nontender and there is no CVA tenderness. Lungs have prolonged exhalation phase and coarse wheezes are  noted with forced exhalation. There are no rales or rhonchi. Chest is nontender. Heart has regular rate and rhythm without murmur. Abdomen is soft, flat, nontender without masses or hepatosplenomegaly and peristalsis is normoactive. Extremities have no cyanosis or edema, full range of motion is present. Skin is warm and dry without rash. Neurologic: Mental status is normal, cranial nerves are intact, there are no motor or sensory deficits.  ED Course  Procedures (including critical care time)  Dg Chest 2 View  10/20/2012   *RADIOLOGY REPORT*  Clinical Data: Cough  CHEST - 2 VIEW  Comparison: 03/17/2009  Findings: Aorta is ectatic and unfolded.  Mild cardiomegaly noted with central vascular congestion but no overt edema.  Bibasilar scarring is stable with hyperaeration compatible with COPD.  No acute osseous finding.  IMPRESSION: Cardiomegaly with COPD.  No acute finding.   Original Report Authenticated By: Christiana Pellant, M.D.   Images viewed by me.   1. Acute  bronchitis       MDM  Cough which most likely is acute bronchitis. Chest x-ray will be obtained to rule out pneumonia and she will be given a therapeutic trial of albuterol with Atrovent.  She feels much better after her above noted treatment. On exam, lungs are clear. She is discharged with prescription for prednisone. She has Xopenex inhaler at home and she is advised to use that as needed for coughing.      Dione Booze, MD 10/20/12 2047

## 2012-10-20 NOTE — ED Notes (Signed)
Cough, denies N V D CP LOC SOB, reports frontal HA from coughing. Airway patent and intact.

## 2013-04-26 ENCOUNTER — Encounter (HOSPITAL_BASED_OUTPATIENT_CLINIC_OR_DEPARTMENT_OTHER): Payer: Self-pay | Admitting: Emergency Medicine

## 2013-04-26 ENCOUNTER — Emergency Department (HOSPITAL_BASED_OUTPATIENT_CLINIC_OR_DEPARTMENT_OTHER): Payer: Medicare HMO

## 2013-04-26 ENCOUNTER — Emergency Department (HOSPITAL_BASED_OUTPATIENT_CLINIC_OR_DEPARTMENT_OTHER)
Admission: EM | Admit: 2013-04-26 | Discharge: 2013-04-26 | Disposition: A | Discharge status: Home / Self Care | Payer: Medicare HMO | Attending: Emergency Medicine | Admitting: Emergency Medicine

## 2013-04-26 DIAGNOSIS — I10 Essential (primary) hypertension: Secondary | ICD-10-CM | POA: Insufficient documentation

## 2013-04-26 DIAGNOSIS — M129 Arthropathy, unspecified: Secondary | ICD-10-CM | POA: Insufficient documentation

## 2013-04-26 DIAGNOSIS — J40 Bronchitis, not specified as acute or chronic: Secondary | ICD-10-CM | POA: Insufficient documentation

## 2013-04-26 DIAGNOSIS — Z87891 Personal history of nicotine dependence: Secondary | ICD-10-CM | POA: Insufficient documentation

## 2013-04-26 DIAGNOSIS — Z79899 Other long term (current) drug therapy: Secondary | ICD-10-CM | POA: Insufficient documentation

## 2013-04-26 DIAGNOSIS — Z7982 Long term (current) use of aspirin: Secondary | ICD-10-CM | POA: Insufficient documentation

## 2013-04-26 MED ORDER — ALBUTEROL SULFATE HFA 108 (90 BASE) MCG/ACT IN AERS
2.0000 | INHALATION_SPRAY | RESPIRATORY_TRACT | Status: DC | PRN
  Administered 2013-04-26: 2 via RESPIRATORY_TRACT
  Filled 2013-04-26: qty 6.7

## 2013-04-26 MED ORDER — ALBUTEROL SULFATE (5 MG/ML) 0.5% IN NEBU
5.0000 mg | INHALATION_SOLUTION | Freq: Once | RESPIRATORY_TRACT | Status: AC
  Administered 2013-04-26: 5 mg via RESPIRATORY_TRACT
  Filled 2013-04-26: qty 1

## 2013-04-26 MED ORDER — PREDNISONE 10 MG PO TABS: 20.0000 mg | ORAL_TABLET | Freq: Two times a day (BID) | ORAL | Status: DC

## 2013-04-26 NOTE — ED Provider Notes (Signed)
CSN: [id number]     Arrival date & time 04/26/13  0940 History   First MD Initiated Contact with Patient 04/26/13 1021     Chief Complaint  Patient presents with  . Wheezing   (Consider location/radiation/quality/duration/timing/severity/associated sxs/prior Treatment) HPI Comments: Patient is an 79 year old female with history of chronic bronchitis. She presents today with complaints of wheezing for the past 10 days. 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 She has been using a steroid inhaler without much relief. She denies any fevers, chills, or productive cough. She denies any chest pain.  Patient is a 79 y.o. female presenting with wheezing. The history is provided by the patient.  Wheezing Severity:  Mild Severity compared to prior episodes:  Similar Onset quality:  Gradual Duration:  10 days Timing:  Constant Progression:  Worsening Chronicity:  New Relieved by:  Nothing Worsened by:  Nothing tried Ineffective treatments:  None tried Associated symptoms: cough   Associated symptoms: no chest pain, no chest tightness, no fatigue and no fever     Past Medical History  Diagnosis Date  . Hypertension   . Hypercholesteremia   . Arthritis   . Bronchitis    Past Surgical History  Procedure Laterality Date  . Vaginal hysterectomy     No family history on file. History  Substance Use Topics  . Smoking status: Former Smoker -- 0.25 packs/day for 1 years    Types: Cigarettes    Quit date: 09/27/1961  . Smokeless tobacco: Never Used  . Alcohol Use: No   OB History   Grav Para Term Preterm Abortions TAB SAB Ect Mult Living   4 4 4             Review of Systems  Constitutional: Negative for fever and fatigue.  Respiratory: Positive for cough and wheezing. Negative for chest tightness.   Cardiovascular: Negative for chest pain.  All other systems reviewed and are negative.    Allergies  Ivp dye  Home  Medications   Current Outpatient Rx  Name  Route  Sig  Dispense  Refill  . amLODipine (NORVASC) 10 MG tablet   Oral   Take 10 mg by mouth daily.         Marland Kitchen aspirin 81 MG tablet   Oral   Take 81 mg by mouth daily.         Marland Kitchen atorvastatin (LIPITOR) 20 MG tablet   Oral   Take 20 mg by mouth daily.         . furosemide (LASIX) 20 MG tablet   Oral   Take 20 mg by mouth 2 (two) times daily.         . hydrALAZINE (APRESOLINE) 10 MG tablet   Oral   Take 10 mg by mouth 3 (three) times daily.         Marland Kitchen lisinopril (PRINIVIL,ZESTRIL) 40 MG tablet   Oral   Take 40 mg by mouth daily.          BP 161/64  Pulse 71  Temp(Src) 98.8 F (37.1 C) (Oral)  Resp 16  Ht 5\' 4"  (1.626 m)  Wt 135 lb (61.236 kg)  BMI 23.16 kg/m2  SpO2 100% Physical Exam  Nursing note and vitals reviewed. Constitutional: She is oriented to person, place, and time. She appears well-developed and well-nourished. No distress.  HENT:  Head: Normocephalic and atraumatic.  Neck: Normal range of motion. Neck supple.  Cardiovascular: Normal rate and regular rhythm.  Exam reveals no gallop and no friction rub.   No murmur heard. Pulmonary/Chest: Effort normal. No respiratory distress. She has wheezes.  There are slight expiratory wheezes bilaterally.  Abdominal: Soft. Bowel sounds are normal. She exhibits no distension. There is no tenderness.  Musculoskeletal: Normal range of motion.  Neurological: She is alert and oriented to person, place, and time.  Skin: Skin is warm and dry. She is not diaphoretic.    ED Course  Procedures (including critical care time) Labs Review Labs Reviewed - No data to display Imaging Review No results found.    MDM  No diagnosis found. Patient is an 79 year old female who presents with wheezing and chest congestion for the past 10 days. She says this started after working in her yard raking leaves. She denies any fevers, productive cough, and chest x-ray in the  emergency department does not reveal evidence for pneumonia. It does suggest bronchitis versus COPD. She was given an albuterol treatment and an albuterol inhaler will be added to her steroid inhaler. I feel she is stable for discharge with prednisone. She is to return as needed if her symptoms worsen or change.    Geoffery Lyons, MD 04/26/13 1116

## 2013-04-26 NOTE — ED Notes (Signed)
Wheezing x one week.  No known fever.  Some sob.

## 2013-06-20 ENCOUNTER — Emergency Department (HOSPITAL_BASED_OUTPATIENT_CLINIC_OR_DEPARTMENT_OTHER)
Admission: EM | Admit: 2013-06-20 | Discharge: 2013-06-21 | Disposition: A | Discharge status: Home / Self Care | Payer: Medicare HMO | Attending: Emergency Medicine | Admitting: Emergency Medicine

## 2013-06-20 ENCOUNTER — Encounter (HOSPITAL_BASED_OUTPATIENT_CLINIC_OR_DEPARTMENT_OTHER): Payer: Self-pay | Admitting: Emergency Medicine

## 2013-06-20 DIAGNOSIS — Z7982 Long term (current) use of aspirin: Secondary | ICD-10-CM | POA: Insufficient documentation

## 2013-06-20 DIAGNOSIS — M129 Arthropathy, unspecified: Secondary | ICD-10-CM | POA: Insufficient documentation

## 2013-06-20 DIAGNOSIS — Z87891 Personal history of nicotine dependence: Secondary | ICD-10-CM | POA: Insufficient documentation

## 2013-06-20 DIAGNOSIS — Z8709 Personal history of other diseases of the respiratory system: Secondary | ICD-10-CM | POA: Insufficient documentation

## 2013-06-20 DIAGNOSIS — IMO0002 Reserved for concepts with insufficient information to code with codable children: Secondary | ICD-10-CM | POA: Insufficient documentation

## 2013-06-20 DIAGNOSIS — I10 Essential (primary) hypertension: Secondary | ICD-10-CM | POA: Insufficient documentation

## 2013-06-20 DIAGNOSIS — Z79899 Other long term (current) drug therapy: Secondary | ICD-10-CM | POA: Insufficient documentation

## 2013-06-20 DIAGNOSIS — E7800 Pure hypercholesterolemia, unspecified: Secondary | ICD-10-CM | POA: Insufficient documentation

## 2013-06-20 NOTE — Discharge Instructions (Signed)
Arterial Hypertension °Arterial hypertension (high blood pressure) is a condition of elevated pressure in your blood vessels. Hypertension over a long period of time is a risk factor for strokes, heart attacks, and heart failure. It is also the leading cause of kidney (renal) failure.  °CAUSES  °· In Adults -- Over 90% of all hypertension has no known cause. This is called essential or primary hypertension. In the other 10% of people with hypertension, the increase in blood pressure is caused by another disorder. This is called secondary hypertension. Important causes of secondary hypertension are: °· Heavy alcohol use. °· Obstructive sleep apnea. °· Hyperaldosterosim (Conn's syndrome). °· Steroid use. °· Chronic kidney failure. °· Hyperparathyroidism. °· Medications. °· Renal artery stenosis. °· Pheochromocytoma. °· Cushing's disease. °· Coarctation of the aorta. °· Scleroderma renal crisis. °· Licorice (in excessive amounts). °· Drugs (cocaine, methamphetamine). °Your caregiver can explain any items above that apply to you. °· In Children -- Secondary hypertension is more common and should always be considered. °· Pregnancy -- Few women of childbearing age have high blood pressure. However, up to 10% of them develop hypertension of pregnancy. Generally, this will not harm the woman. It may be a sign of 3 complications of pregnancy: preeclampsia, HELLP syndrome, and eclampsia. Follow up and control with medication is necessary. °SYMPTOMS  °· This condition normally does not produce any noticeable symptoms. It is usually found during a routine exam. °· Malignant hypertension is a late problem of high blood pressure. It may have the following symptoms: °· Headaches. °· Blurred vision. °· End-organ damage (this means your kidneys, heart, lungs, and other organs are being damaged). °· Stressful situations can increase the blood pressure. If a person with normal blood pressure has their blood pressure go up while being  seen by their caregiver, this is often termed "white coat hypertension." Its importance is not known. It may be related with eventually developing hypertension or complications of hypertension. °· Hypertension is often confused with mental tension, stress, and anxiety. °DIAGNOSIS  °The diagnosis is made by 3 separate blood pressure measurements. They are taken at least 1 week apart from each other. If there is organ damage from hypertension, the diagnosis may be made without repeat measurements. °Hypertension is usually identified by having blood pressure readings: °· Above 140/90 mmHg measured in both arms, at 3 separate times, over a couple weeks. °· Over 130/80 mmHg should be considered a risk factor and may require treatment in patients with diabetes. °Blood pressure readings over 120/80 mmHg are called "pre-hypertension" even in non-diabetic patients. °To get a true blood pressure measurement, use the following guidelines. Be aware of the factors that can alter blood pressure readings. °· Take measurements at least 1 hour after caffeine. °· Take measurements 30 minutes after smoking and without any stress. This is another reason to quit smoking  it raises your blood pressure. °· Use a proper cuff size. Ask your caregiver if you are not sure about your cuff size. °· Most home blood pressure cuffs are automatic. They will measure systolic and diastolic pressures. The systolic pressure is the pressure reading at the start of sounds. Diastolic pressure is the pressure at which the sounds disappear. If you are elderly, measure pressures in multiple postures. Try sitting, lying or standing. °· Sit at rest for a minimum of 5 minutes before taking measurements. °· You should not be on any medications like decongestants. These are found in many cold medications. °· Record your blood pressure readings and review   them with your caregiver. °If you have hypertension: °· Your caregiver may do tests to be sure you do not have  secondary hypertension (see "causes" above). °· Your caregiver may also look for signs of metabolic syndrome. This is also called Syndrome X or Insulin Resistance Syndrome. You may have this syndrome if you have type 2 diabetes, abdominal obesity, and abnormal blood lipids in addition to hypertension. °· Your caregiver will take your medical and family history and perform a physical exam. °· Diagnostic tests may include blood tests (for glucose, cholesterol, potassium, and kidney function), a urinalysis, or an EKG. Other tests may also be necessary depending on your condition. °PREVENTION  °There are important lifestyle issues that you can adopt to reduce your chance of developing hypertension: °· Maintain a normal weight. °· Limit the amount of salt (sodium) in your diet. °· Exercise often. °· Limit alcohol intake. °· Get enough potassium in your diet. Discuss specific advice with your caregiver. °· Follow a DASH diet (dietary approaches to stop hypertension). This diet is rich in fruits, vegetables, and low-fat dairy products, and avoids certain fats. °PROGNOSIS  °Essential hypertension cannot be cured. Lifestyle changes and medical treatment can lower blood pressure and reduce complications. The prognosis of secondary hypertension depends on the underlying cause. Many people whose hypertension is controlled with medicine or lifestyle changes can live a normal, healthy life.  °RISKS AND COMPLICATIONS  °While high blood pressure alone is not an illness, it often requires treatment due to its short- and long-term effects on many organs. Hypertension increases your risk for: °· CVAs or strokes (cerebrovascular accident). °· Heart failure due to chronically high blood pressure (hypertensive cardiomyopathy). °· Heart attack (myocardial infarction). °· Damage to the retina (hypertensive retinopathy). °· Kidney failure (hypertensive nephropathy). °Your caregiver can explain list items above that apply to you. Treatment  of hypertension can significantly reduce the risk of complications. °TREATMENT  °· For overweight patients, weight loss and regular exercise are recommended. Physical fitness lowers blood pressure. °· Mild hypertension is usually treated with diet and exercise. A diet rich in fruits and vegetables, fat-free dairy products, and foods low in fat and salt (sodium) can help lower blood pressure. Decreasing salt intake decreases blood pressure in a 1/3 of people. °· Stop smoking if you are a smoker. °The steps above are highly effective in reducing blood pressure. While these actions are easy to suggest, they are difficult to achieve. Most patients with moderate or severe hypertension end up requiring medications to bring their blood pressure down to a normal level. There are several classes of medications for treatment. Blood pressure pills (antihypertensives) will lower blood pressure by their different actions. Lowering the blood pressure by 10 mmHg may decrease the risk of complications by as much as 25%. °The goal of treatment is effective blood pressure control. This will reduce your risk for complications. Your caregiver will help you determine the best treatment for you according to your lifestyle. What is excellent treatment for one person, may not be for you. °HOME CARE INSTRUCTIONS  °· Do not smoke. °· Follow the lifestyle changes outlined in the "Prevention" section. °· If you are on medications, follow the directions carefully. Blood pressure medications must be taken as prescribed. Skipping doses reduces their benefit. It also puts you at risk for problems. °· Follow up with your caregiver, as directed. °· If you are asked to monitor your blood pressure at home, follow the guidelines in the "Diagnosis" section above. °SEEK MEDICAL CARE   IF:  °· You think you are having medication side effects. °· You have recurrent headaches or lightheadedness. °· You have swelling in your ankles. °· You have trouble with  your vision. °SEEK IMMEDIATE MEDICAL CARE IF:  °· You have sudden onset of chest pain or pressure, difficulty breathing, or other symptoms of a heart attack. °· You have a severe headache. °· You have symptoms of a stroke (such as sudden weakness, difficulty speaking, difficulty walking). °MAKE SURE YOU:  °· Understand these instructions. °· Will watch your condition. °· Will get help right away if you are not doing well or get worse. °Document Released: 05/07/2005 Document Revised: 07/30/2011 Document Reviewed: 12/05/2006 °[employer]® Patient Information ©2014 [employer]. ° °

## 2013-06-20 NOTE — ED Provider Notes (Signed)
CSN: [id number]     Arrival date & time 06/20/13  2224 History  This chart was scribed for Krista Hacker, MD by Roe Coombs, ED Scribe. The patient was seen in room MH08/MH08. Patient's care was started at 11:03 PM.   Chief Complaint  Patient presents with  . Hypertension    The history is provided by the patient. No language interpreter was used.    HPI Comments: Krista Joseph is a 80 y.o. female who presents to the Emergency Department complaining of elevated blood pressure for the past couple of days. Patient does not typically check her blood pressure daily, and before yesterday, her last check was sometime last week. She states that when she measured her blood pressure at home yesterday, her systolic BP was in the 403K. She was concerned so she went to see her PMD and during this visit, her blood pressure was in the 130s. Her PCP suggested that the variability may have been due to her blood pressure cuff. She says that this morning her systolic pressure was in the 150s and a few hours later in the 160s when she took measurements at home. Patient reports that she checked earlier this evening at [facility] and her BP was 170s/80s. She became concerned about these elevated readings and decided to come to the ED for evaluation. Patient denies associated headache, shortness of breath or chest pain. She is currently treated for HTN with lisinopril 40 mg daily, hydralazine 20 mg bid, and amlodipine 5 mg daily (decreased from 10 mg daily about 3 months ago). Her other medical history includes hypercholesteremia and arthritis. She does not smoke.   Past Medical History  Diagnosis Date  . Hypertension   . Hypercholesteremia   . Arthritis   . Bronchitis    Past Surgical History  Procedure Laterality Date  . Vaginal hysterectomy     No family history on file. History  Substance Use Topics  . Smoking status: Former Smoker -- 0.25 packs/day for 1 years    Types: Cigarettes    Quit date:  09/27/1961  . Smokeless tobacco: Never Used  . Alcohol Use: No   OB History   Grav Para Term Preterm Abortions TAB SAB Ect Mult Living   4 4 4             Review of Systems  Constitutional: Negative for fever.  Respiratory: Negative for cough, chest tightness and shortness of breath.   Cardiovascular: Negative for chest pain.  Gastrointestinal: Negative for nausea, vomiting and abdominal pain.  Neurological: Negative for headaches.  Psychiatric/Behavioral: Negative for confusion.  All other systems reviewed and are negative.    Allergies  Ivp dye  Home Medications   Current Outpatient Rx  Name  Route  Sig  Dispense  Refill  . amLODipine (NORVASC) 10 MG tablet   Oral   Take 10 mg by mouth daily.         Marland Kitchen aspirin 81 MG tablet   Oral   Take 81 mg by mouth daily.         Marland Kitchen atorvastatin (LIPITOR) 20 MG tablet   Oral   Take 20 mg by mouth daily.         . furosemide (LASIX) 20 MG tablet   Oral   Take 20 mg by mouth 2 (two) times daily.         . hydrALAZINE (APRESOLINE) 10 MG tablet   Oral   Take 5 mg by mouth daily.          Marland Kitchen  lisinopril (PRINIVIL,ZESTRIL) 40 MG tablet   Oral   Take 40 mg by mouth daily.         . predniSONE (DELTASONE) 10 MG tablet   Oral   Take 2 tablets (20 mg total) by mouth 2 (two) times daily.   20 tablet   0    Triage Vitals: BP 168/88  Pulse 65  Temp(Src) 98.4 F (36.9 C) (Oral)  Resp 16  Ht 5\' 1"  (1.549 m)  Wt 136 lb (61.689 kg)  BMI 25.71 kg/m2  SpO2 99% Physical Exam  Nursing note and vitals reviewed. Constitutional: She is oriented to person, place, and time. She appears well-developed and well-nourished. No distress.  Younger than stated age  HENT:  Head: Normocephalic and atraumatic.  Eyes: EOM are normal.  Cardiovascular: Normal rate, regular rhythm and normal heart sounds.   No murmur heard. Pulmonary/Chest: Effort normal and breath sounds normal. No respiratory distress. She has no wheezes.   Abdominal: Soft. Bowel sounds are normal. There is no tenderness.  Musculoskeletal: She exhibits no edema.  Neurological: She is alert and oriented to person, place, and time.  Skin: Skin is warm and dry.  Psychiatric: She has a normal mood and affect.    ED Course  Procedures (including critical care time)  Labs Review Labs Reviewed - No data to display  Imaging Review No results found.  EKG Interpretation    Date/Time:  Saturday June 20 2013 23:39:03 EST Ventricular Rate:  60 PR Interval:  144 QRS Duration: 98 QT Interval:  454 QTC Calculation: 454 R Axis:   17 Text Interpretation:  Sinus rhythm with Premature atrial complexes Otherwise normal ECG No prior for comparison Confirmed by HORTON  MD, COURTNEY ([id number]) on 06/20/2013 11:44:36 PM            MDM   1. Hypertension    This is an 80 year old female who presents with elevated blood pressure readings. She is nontoxic-appearing on exam. She is completely asymptomatic. Her physical exam is reassuring. Manual blood pressure here was 152/78. I discussed with the patient that I would not likely change any of her blood pressure medications and would defer any changes to her primary Dr. She has recently had a decrease in her amlodipine by her cardiologist. This could be why her blood pressures are fluctuating. Patient was given strict instructions to monitor for headache, chest pain or shortness of breath, or air or any new or worsening symptoms.  I personally performed the services described in this documentation, which was scribed in my presence. The recorded information has been reviewed and is accurate.   Krista Hacker, MD 06/20/13 [phone]

## 2013-06-20 NOTE — ED Notes (Signed)
Woke up this morning with elevated BP, 153/88.  It was 179/88 at [facility] just PTA.  Denies chest pain, headache, dizziness, SHOB.  States the only concern that she has is that her BP readings are elevated.

## 2013-06-21 ENCOUNTER — Emergency Department (HOSPITAL_BASED_OUTPATIENT_CLINIC_OR_DEPARTMENT_OTHER)
Admission: EM | Admit: 2013-06-21 | Discharge: 2013-06-21 | Disposition: A | Discharge status: Home / Self Care | Payer: Medicare HMO | Attending: Emergency Medicine | Admitting: Emergency Medicine

## 2013-06-21 ENCOUNTER — Encounter (HOSPITAL_BASED_OUTPATIENT_CLINIC_OR_DEPARTMENT_OTHER): Payer: Self-pay | Admitting: Emergency Medicine

## 2013-06-21 DIAGNOSIS — Z8709 Personal history of other diseases of the respiratory system: Secondary | ICD-10-CM | POA: Insufficient documentation

## 2013-06-21 DIAGNOSIS — Z79899 Other long term (current) drug therapy: Secondary | ICD-10-CM | POA: Insufficient documentation

## 2013-06-21 DIAGNOSIS — M129 Arthropathy, unspecified: Secondary | ICD-10-CM | POA: Insufficient documentation

## 2013-06-21 DIAGNOSIS — F411 Generalized anxiety disorder: Secondary | ICD-10-CM | POA: Insufficient documentation

## 2013-06-21 DIAGNOSIS — I10 Essential (primary) hypertension: Secondary | ICD-10-CM | POA: Insufficient documentation

## 2013-06-21 DIAGNOSIS — R110 Nausea: Secondary | ICD-10-CM | POA: Insufficient documentation

## 2013-06-21 DIAGNOSIS — Z7982 Long term (current) use of aspirin: Secondary | ICD-10-CM | POA: Insufficient documentation

## 2013-06-21 DIAGNOSIS — Z87891 Personal history of nicotine dependence: Secondary | ICD-10-CM | POA: Insufficient documentation

## 2013-06-21 DIAGNOSIS — E7800 Pure hypercholesterolemia, unspecified: Secondary | ICD-10-CM | POA: Insufficient documentation

## 2013-06-21 DIAGNOSIS — IMO0002 Reserved for concepts with insufficient information to code with codable children: Secondary | ICD-10-CM | POA: Insufficient documentation

## 2013-06-21 MED ORDER — AMLODIPINE BESYLATE 10 MG PO TABS: 5.0000 mg | ORAL_TABLET | Freq: Two times a day (BID) | ORAL | Status: DC

## 2013-06-21 NOTE — ED Notes (Signed)
D/c home with family- no new rx given

## 2013-06-21 NOTE — Discharge Instructions (Signed)
Arterial Hypertension °Arterial hypertension (high blood pressure) is a condition of elevated pressure in your blood vessels. Hypertension over a long period of time is a risk factor for strokes, heart attacks, and heart failure. It is also the leading cause of kidney (renal) failure.  °CAUSES  °· In Adults -- Over 90% of all hypertension has no known cause. This is called essential or primary hypertension. In the other 10% of people with hypertension, the increase in blood pressure is caused by another disorder. This is called secondary hypertension. Important causes of secondary hypertension are: °· Heavy alcohol use. °· Obstructive sleep apnea. °· Hyperaldosterosim (Conn's syndrome). °· Steroid use. °· Chronic kidney failure. °· Hyperparathyroidism. °· Medications. °· Renal artery stenosis. °· Pheochromocytoma. °· Cushing's disease. °· Coarctation of the aorta. °· Scleroderma renal crisis. °· Licorice (in excessive amounts). °· Drugs (cocaine, methamphetamine). °Your caregiver can explain any items above that apply to you. °· In Children -- Secondary hypertension is more common and should always be considered. °· Pregnancy -- Few women of childbearing age have high blood pressure. However, up to 10% of them develop hypertension of pregnancy. Generally, this will not harm the woman. It may be a sign of 3 complications of pregnancy: preeclampsia, HELLP syndrome, and eclampsia. Follow up and control with medication is necessary. °SYMPTOMS  °· This condition normally does not produce any noticeable symptoms. It is usually found during a routine exam. °· Malignant hypertension is a late problem of high blood pressure. It may have the following symptoms: °· Headaches. °· Blurred vision. °· End-organ damage (this means your kidneys, heart, lungs, and other organs are being damaged). °· Stressful situations can increase the blood pressure. If a person with normal blood pressure has their blood pressure go up while being  seen by their caregiver, this is often termed "white coat hypertension." Its importance is not known. It may be related with eventually developing hypertension or complications of hypertension. °· Hypertension is often confused with mental tension, stress, and anxiety. °DIAGNOSIS  °The diagnosis is made by 3 separate blood pressure measurements. They are taken at least 1 week apart from each other. If there is organ damage from hypertension, the diagnosis may be made without repeat measurements. °Hypertension is usually identified by having blood pressure readings: °· Above 140/90 mmHg measured in both arms, at 3 separate times, over a couple weeks. °· Over 130/80 mmHg should be considered a risk factor and may require treatment in patients with diabetes. °Blood pressure readings over 120/80 mmHg are called "pre-hypertension" even in non-diabetic patients. °To get a true blood pressure measurement, use the following guidelines. Be aware of the factors that can alter blood pressure readings. °· Take measurements at least 1 hour after caffeine. °· Take measurements 30 minutes after smoking and without any stress. This is another reason to quit smoking  it raises your blood pressure. °· Use a proper cuff size. Ask your caregiver if you are not sure about your cuff size. °· Most home blood pressure cuffs are automatic. They will measure systolic and diastolic pressures. The systolic pressure is the pressure reading at the start of sounds. Diastolic pressure is the pressure at which the sounds disappear. If you are elderly, measure pressures in multiple postures. Try sitting, lying or standing. °· Sit at rest for a minimum of 5 minutes before taking measurements. °· You should not be on any medications like decongestants. These are found in many cold medications. °· Record your blood pressure readings and review   them with your caregiver. °If you have hypertension: °· Your caregiver may do tests to be sure you do not have  secondary hypertension (see "causes" above). °· Your caregiver may also look for signs of metabolic syndrome. This is also called Syndrome X or Insulin Resistance Syndrome. You may have this syndrome if you have type 2 diabetes, abdominal obesity, and abnormal blood lipids in addition to hypertension. °· Your caregiver will take your medical and family history and perform a physical exam. °· Diagnostic tests may include blood tests (for glucose, cholesterol, potassium, and kidney function), a urinalysis, or an EKG. Other tests may also be necessary depending on your condition. °PREVENTION  °There are important lifestyle issues that you can adopt to reduce your chance of developing hypertension: °· Maintain a normal weight. °· Limit the amount of salt (sodium) in your diet. °· Exercise often. °· Limit alcohol intake. °· Get enough potassium in your diet. Discuss specific advice with your caregiver. °· Follow a DASH diet (dietary approaches to stop hypertension). This diet is rich in fruits, vegetables, and low-fat dairy products, and avoids certain fats. °PROGNOSIS  °Essential hypertension cannot be cured. Lifestyle changes and medical treatment can lower blood pressure and reduce complications. The prognosis of secondary hypertension depends on the underlying cause. Many people whose hypertension is controlled with medicine or lifestyle changes can live a normal, healthy life.  °RISKS AND COMPLICATIONS  °While high blood pressure alone is not an illness, it often requires treatment due to its short- and long-term effects on many organs. Hypertension increases your risk for: °· CVAs or strokes (cerebrovascular accident). °· Heart failure due to chronically high blood pressure (hypertensive cardiomyopathy). °· Heart attack (myocardial infarction). °· Damage to the retina (hypertensive retinopathy). °· Kidney failure (hypertensive nephropathy). °Your caregiver can explain list items above that apply to you. Treatment  of hypertension can significantly reduce the risk of complications. °TREATMENT  °· For overweight patients, weight loss and regular exercise are recommended. Physical fitness lowers blood pressure. °· Mild hypertension is usually treated with diet and exercise. A diet rich in fruits and vegetables, fat-free dairy products, and foods low in fat and salt (sodium) can help lower blood pressure. Decreasing salt intake decreases blood pressure in a 1/3 of people. °· Stop smoking if you are a smoker. °The steps above are highly effective in reducing blood pressure. While these actions are easy to suggest, they are difficult to achieve. Most patients with moderate or severe hypertension end up requiring medications to bring their blood pressure down to a normal level. There are several classes of medications for treatment. Blood pressure pills (antihypertensives) will lower blood pressure by their different actions. Lowering the blood pressure by 10 mmHg may decrease the risk of complications by as much as 25%. °The goal of treatment is effective blood pressure control. This will reduce your risk for complications. Your caregiver will help you determine the best treatment for you according to your lifestyle. What is excellent treatment for one person, may not be for you. °HOME CARE INSTRUCTIONS  °· Do not smoke. °· Follow the lifestyle changes outlined in the "Prevention" section. °· If you are on medications, follow the directions carefully. Blood pressure medications must be taken as prescribed. Skipping doses reduces their benefit. It also puts you at risk for problems. °· Follow up with your caregiver, as directed. °· If you are asked to monitor your blood pressure at home, follow the guidelines in the "Diagnosis" section above. °SEEK MEDICAL CARE   IF:  °· You think you are having medication side effects. °· You have recurrent headaches or lightheadedness. °· You have swelling in your ankles. °· You have trouble with  your vision. °SEEK IMMEDIATE MEDICAL CARE IF:  °· You have sudden onset of chest pain or pressure, difficulty breathing, or other symptoms of a heart attack. °· You have a severe headache. °· You have symptoms of a stroke (such as sudden weakness, difficulty speaking, difficulty walking). °MAKE SURE YOU:  °· Understand these instructions. °· Will watch your condition. °· Will get help right away if you are not doing well or get worse. °Document Released: 05/07/2005 Document Revised: 07/30/2011 Document Reviewed: 12/05/2006 °[employer]® Patient Information ©2014 [employer]. ° °

## 2013-06-21 NOTE — ED Notes (Signed)
Patient states that her BP was high at home.

## 2013-06-21 NOTE — ED Notes (Signed)
Pt concerned that her bp is elevated. Seen here yesterday for same

## 2013-06-21 NOTE — ED Notes (Signed)
HBP at home

## 2013-06-21 NOTE — ED Provider Notes (Signed)
CSN: [id number]     Arrival date & time 06/21/13  2055 History  This chart was scribed for Tanna Furry, MD by Jenne Campus, ED Scribe. This patient was seen in room MH02/MH02 and the patient's care was started at 9:21 PM.   Chief Complaint  Patient presents with  . Hypertension    The history is provided by the patient. No language interpreter was used.    Krista Joseph is a 80 y.o. female with a h/o HTN who presents to the Emergency Department complaining of ongoing elevated blood pressure for the past 3 days. Patient denies routinely checking her blood pressure. She states that the last time she routinely checked her BP was several months ago. Before 2 days ago, she last checked her BP sometime last week. Within the past few days, her systolic BP has been in the 150s. She was evaluated by her PCP for the same and was told that the variability in her BP may have be due to her blood pressure cuff. She was seen in the ED yesterday for the same due to a gradual increase in her systolic BP from 854O to 270J over the course of the day. She is currently treated for HTN with lisinopril 40 mg daily, hydralazine 20 mg bid, and amlodipine 5 mg daily (decreased from 10 mg daily about 2 months ago) which she takes in the morning at 8:30 AM. Highest BP in the last 2 days was was 179/101 PTA causing her enough concern to return to the ED tonight. She denies any h/o CHF, MI or prior CVA. She denies any extremity weakness or numbness, HA or other concerning symptoms. She also mentions having very mild nausea after noting that her BP was elevated.   Past Medical History  Diagnosis Date  . Hypertension   . Hypercholesteremia   . Arthritis   . Bronchitis    Past Surgical History  Procedure Laterality Date  . Vaginal hysterectomy     No family history on file. History  Substance Use Topics  . Smoking status: Former Smoker -- 0.25 packs/day for 1 years    Types: Cigarettes    Quit date: 09/27/1961  .  Smokeless tobacco: Never Used  . Alcohol Use: No   OB History   Grav Para Term Preterm Abortions TAB SAB Ect Mult Living   4 4 4             Review of Systems  Constitutional: Negative for fever, chills, diaphoresis, appetite change and fatigue.  HENT: Negative for mouth sores, sore throat and trouble swallowing.   Eyes: Negative for visual disturbance.  Respiratory: Negative for cough, chest tightness, shortness of breath and wheezing.   Cardiovascular: Negative for chest pain.  Gastrointestinal: Positive for nausea (very mild). Negative for vomiting, abdominal pain, diarrhea and abdominal distention.  Endocrine: Negative for polydipsia, polyphagia and polyuria.  Genitourinary: Negative for dysuria, frequency and hematuria.  Musculoskeletal: Negative for gait problem.  Skin: Negative for color change, pallor and rash.  Neurological: Negative for dizziness, syncope, light-headedness and headaches.  Hematological: Does not bruise/bleed easily.  Psychiatric/Behavioral: Negative for behavioral problems and confusion.    Allergies  Ivp dye  Home Medications   Current Outpatient Rx  Name  Route  Sig  Dispense  Refill  . amLODipine (NORVASC) 10 MG tablet   Oral   Take 10 mg by mouth daily.         Marland Kitchen aspirin 81 MG tablet   Oral   Take 81  mg by mouth daily.         Marland Kitchen atorvastatin (LIPITOR) 20 MG tablet   Oral   Take 20 mg by mouth daily.         . furosemide (LASIX) 20 MG tablet   Oral   Take 20 mg by mouth 2 (two) times daily.         . hydrALAZINE (APRESOLINE) 10 MG tablet   Oral   Take 5 mg by mouth daily.          Marland Kitchen lisinopril (PRINIVIL,ZESTRIL) 40 MG tablet   Oral   Take 40 mg by mouth daily.         . predniSONE (DELTASONE) 10 MG tablet   Oral   Take 2 tablets (20 mg total) by mouth 2 (two) times daily.   20 tablet   0    Triage Vitals: BP 190/80  Pulse 70  Temp(Src) 98.1 F (36.7 C) (Oral)  Resp 16  SpO2 99%  Physical Exam  Nursing note  and vitals reviewed. Constitutional: She is oriented to person, place, and time. She appears well-developed and well-nourished. No distress.  HENT:  Head: Normocephalic.  Eyes: Conjunctivae are normal. Pupils are equal, round, and reactive to light. No scleral icterus.  Neck: Normal range of motion. Neck supple. Carotid bruit is not present. No thyromegaly present.  Cardiovascular: Normal rate and regular rhythm.  Exam reveals no gallop and no friction rub.   No murmur heard. Pulmonary/Chest: Effort normal and breath sounds normal. No respiratory distress. She has no wheezes. She has no rales.  Abdominal: Soft. Bowel sounds are normal. She exhibits no distension. There is no tenderness. There is no rebound.  Musculoskeletal: Normal range of motion.  Neurological: She is alert and oriented to person, place, and time.  Skin: Skin is warm and dry. No rash noted.  Psychiatric: Her behavior is normal. Her mood appears anxious (mildly ).    ED Course  Procedures (including critical care time)  DIAGNOSTIC STUDIES: Oxygen Saturation is 99%% on RA, normal by my interpretation.    COORDINATION OF CARE: 9:30 PM-Informed pt of her BP being 181/71. Advised pt that treating her symptoms gradually is in her best interest. Discussed discharge plan which includes increasing her amlodipine dose back to 20 mg and splitting it (10 mg in the morning and 10 mg at night) with pt and pt agreed to plan. Also advised pt to follow up as needed and pt agreed. Addressed symptoms to return for with pt.   Labs Review Labs Reviewed - No data to display Imaging Review No results found.  EKG Interpretation   None       MDM   1. Hypertension    Not concerned about her current blood pressures. I don't feel this is representative malignant hypertension hypertensive urgency. I recommended that she resume her 10 mg of Norvasc. [facility]stra color physician tomorrow for recheck blood pressure of the next few days. She  develops headache, vision changes, stroke symptoms, chest pain, shortness of breath as other symptoms then return to ER or PCP for recheck.  I personally performed the services described in this documentation, which was scribed in my presence. The recorded information has been reviewed and is accurate.    Tanna Furry, MD 06/21/13 2138

## 2013-06-22 ENCOUNTER — Encounter (HOSPITAL_BASED_OUTPATIENT_CLINIC_OR_DEPARTMENT_OTHER): Payer: Self-pay | Admitting: Emergency Medicine

## 2014-03-22 ENCOUNTER — Encounter (HOSPITAL_BASED_OUTPATIENT_CLINIC_OR_DEPARTMENT_OTHER): Payer: Self-pay | Admitting: Emergency Medicine

## 2014-06-10 ENCOUNTER — Emergency Department (HOSPITAL_BASED_OUTPATIENT_CLINIC_OR_DEPARTMENT_OTHER): Payer: Medicare HMO

## 2014-06-10 ENCOUNTER — Encounter (HOSPITAL_BASED_OUTPATIENT_CLINIC_OR_DEPARTMENT_OTHER): Payer: Self-pay | Admitting: *Deleted

## 2014-06-10 ENCOUNTER — Emergency Department (HOSPITAL_BASED_OUTPATIENT_CLINIC_OR_DEPARTMENT_OTHER)
Admission: EM | Admit: 2014-06-10 | Discharge: 2014-06-10 | Disposition: A | Discharge status: Home / Self Care | Payer: Medicare HMO | Attending: Emergency Medicine | Admitting: Emergency Medicine

## 2014-06-10 DIAGNOSIS — E785 Hyperlipidemia, unspecified: Secondary | ICD-10-CM | POA: Insufficient documentation

## 2014-06-10 DIAGNOSIS — Y9289 Other specified places as the place of occurrence of the external cause: Secondary | ICD-10-CM | POA: Insufficient documentation

## 2014-06-10 DIAGNOSIS — Z7952 Long term (current) use of systemic steroids: Secondary | ICD-10-CM | POA: Insufficient documentation

## 2014-06-10 DIAGNOSIS — I10 Essential (primary) hypertension: Secondary | ICD-10-CM | POA: Insufficient documentation

## 2014-06-10 DIAGNOSIS — Z9889 Other specified postprocedural states: Secondary | ICD-10-CM | POA: Diagnosis not present

## 2014-06-10 DIAGNOSIS — S0990XA Unspecified injury of head, initial encounter: Secondary | ICD-10-CM | POA: Diagnosis not present

## 2014-06-10 DIAGNOSIS — Y998 Other external cause status: Secondary | ICD-10-CM | POA: Diagnosis not present

## 2014-06-10 DIAGNOSIS — E041 Nontoxic single thyroid nodule: Secondary | ICD-10-CM | POA: Diagnosis not present

## 2014-06-10 DIAGNOSIS — Z720 Tobacco use: Secondary | ICD-10-CM | POA: Diagnosis not present

## 2014-06-10 DIAGNOSIS — I2510 Atherosclerotic heart disease of native coronary artery without angina pectoris: Secondary | ICD-10-CM | POA: Diagnosis not present

## 2014-06-10 DIAGNOSIS — Z8709 Personal history of other diseases of the respiratory system: Secondary | ICD-10-CM | POA: Insufficient documentation

## 2014-06-10 DIAGNOSIS — Z79899 Other long term (current) drug therapy: Secondary | ICD-10-CM | POA: Insufficient documentation

## 2014-06-10 DIAGNOSIS — Z7982 Long term (current) use of aspirin: Secondary | ICD-10-CM | POA: Diagnosis not present

## 2014-06-10 DIAGNOSIS — S161XXA Strain of muscle, fascia and tendon at neck level, initial encounter: Secondary | ICD-10-CM | POA: Diagnosis not present

## 2014-06-10 DIAGNOSIS — S199XXA Unspecified injury of neck, initial encounter: Secondary | ICD-10-CM | POA: Diagnosis present

## 2014-06-10 DIAGNOSIS — K219 Gastro-esophageal reflux disease without esophagitis: Secondary | ICD-10-CM | POA: Insufficient documentation

## 2014-06-10 DIAGNOSIS — S39012A Strain of muscle, fascia and tendon of lower back, initial encounter: Secondary | ICD-10-CM

## 2014-06-10 DIAGNOSIS — Y9389 Activity, other specified: Secondary | ICD-10-CM | POA: Diagnosis not present

## 2014-06-10 DIAGNOSIS — M1990 Unspecified osteoarthritis, unspecified site: Secondary | ICD-10-CM | POA: Insufficient documentation
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
## 2014-06-10 NOTE — Discharge Instructions (Signed)
You have a nodule on your thyroid gland that needs to be followed up on by your primary care physician.   Back Pain, Adult Low back pain is very common. About 1 in 5 people have back pain.The cause of low back pain is rarely dangerous. The pain often gets better over time.About half of people with a sudden onset of back pain feel better in just 2 weeks. About 8 in 10 people feel better by 6 weeks.  CAUSES Some common causes of back pain include:  Strain of the muscles or ligaments supporting the spine.  Wear and tear (degeneration) of the spinal discs.  Arthritis.  Direct injury to the back. DIAGNOSIS Most of the time, the direct cause of low back pain is not known.However, back pain can be treated effectively even when the exact cause of the pain is unknown.Answering your caregiver's questions about your overall health and symptoms is one of the most accurate ways to make sure the cause of your pain is not dangerous. If your caregiver needs more information, he or she may order lab work or imaging tests (X-rays or MRIs).However, even if imaging tests show changes in your back, this usually does not require surgery. HOME CARE INSTRUCTIONS For many people, back pain returns.Since low back pain is rarely dangerous, it is often a condition that people can learn to [facility] their own.   Remain active. It is stressful on the back to sit or stand in one place. Do not sit, drive, or stand in one place for more than 30 minutes at a time. Take short walks on level surfaces as soon as pain allows.Try to increase the length of time you walk each day.  Do not stay in bed.Resting more than 1 or 2 days can delay your recovery.  Do not avoid exercise or work.Your body is made to move.It is not dangerous to be active, even though your back may hurt.Your back will likely heal faster if you return to being active before your pain is gone.  Pay attention to your body when you bend and lift. Many  people have less discomfortwhen lifting if they bend their knees, keep the load close to their bodies,and avoid twisting. Often, the most comfortable positions are those that put less stress on your recovering back.  Find a comfortable position to sleep. Use a firm mattress and lie on your side with your knees slightly bent. If you lie on your back, put a pillow under your knees.  Only take over-the-counter or prescription medicines as directed by your caregiver. Over-the-counter medicines to reduce pain and inflammation are often the most helpful.Your caregiver may prescribe muscle relaxant drugs.These medicines help dull your pain so you can more quickly return to your normal activities and healthy exercise.  Put ice on the injured area.  Put ice in a plastic bag.  Place a towel between your skin and the bag.  Leave the ice on for 15-20 minutes, 03-04 times a day for the first 2 to 3 days. After that, ice and heat may be alternated to reduce pain and spasms.  Ask your caregiver about trying back exercises and gentle massage. This may be of some benefit.  Avoid feeling anxious or stressed.Stress increases muscle tension and can worsen back pain.It is important to recognize when you are anxious or stressed and learn ways to manage it.Exercise is a great option. SEEK MEDICAL CARE IF:  You have pain that is not relieved with rest or medicine.  You  have pain that does not improve in 1 week.  You have new symptoms.  You are generally not feeling well. SEEK IMMEDIATE MEDICAL CARE IF:   You have pain that radiates from your back into your legs.  You develop new bowel or bladder control problems.  You have unusual weakness or numbness in your arms or legs.  You develop nausea or vomiting.  You develop abdominal pain.  You feel faint. Document Released: 05/07/2005 Document Revised: 11/06/2011 Document Reviewed: 09/08/2013 [facility] Patient Information 2015 [place], [place]. This  information is not intended to replace advice given to you by your health care provider. Make sure you discuss any questions you have with your health care provider.  Cervical Sprain A cervical sprain is an injury in the neck in which the strong, fibrous tissues (ligaments) that connect your neck bones stretch or tear. Cervical sprains can range from mild to severe. Severe cervical sprains can cause the neck vertebrae to be unstable. This can lead to damage of the spinal cord and can result in serious nervous system problems. The amount of time it takes for a cervical sprain to get better depends on the cause and extent of the injury. Most cervical sprains heal in 1 to 3 weeks. CAUSES  Severe cervical sprains may be caused by:   Contact sport injuries (such as from football, rugby, wrestling, hockey, auto racing, gymnastics, diving, martial arts, or boxing).   Motor vehicle collisions.   Whiplash injuries. This is an injury from a sudden forward and backward whipping movement of the head and neck.  Falls.  Mild cervical sprains may be caused by:   Being in an awkward position, such as while cradling a telephone between your ear and shoulder.   Sitting in a chair that does not offer proper support.   Working at a poorly Landscape architect station.   Looking up or down for long periods of time.  SYMPTOMS   Pain, soreness, stiffness, or a burning sensation in the front, back, or sides of the neck. This discomfort may develop immediately after the injury or slowly, 24 hours or more after the injury.   Pain or tenderness directly in the middle of the back of the neck.   Shoulder or upper back pain.   Limited ability to move the neck.   Headache.   Dizziness.   Weakness, numbness, or tingling in the hands or arms.   Muscle spasms.   Difficulty swallowing or chewing.   Tenderness and swelling of the neck.  DIAGNOSIS  Most of the time your health care provider  can diagnose a cervical sprain by taking your history and doing a physical exam. Your health care provider will ask about previous neck injuries and any known neck problems, such as arthritis in the neck. X-rays may be taken to find out if there are any other problems, such as with the bones of the neck. Other tests, such as a CT scan or MRI, may also be needed.  TREATMENT  Treatment depends on the severity of the cervical sprain. Mild sprains can be treated with rest, keeping the neck in place (immobilization), and pain medicines. Severe cervical sprains are immediately immobilized. Further treatment is done to help with pain, muscle spasms, and other symptoms and may include:  Medicines, such as pain relievers, numbing medicines, or muscle relaxants.   Physical therapy. This may involve stretching exercises, strengthening exercises, and posture training. Exercises and improved posture can help stabilize the neck, strengthen muscles, and help  stop symptoms from returning.  HOME CARE INSTRUCTIONS   Put ice on the injured area.   Put ice in a plastic bag.   Place a towel between your skin and the bag.   Leave the ice on for 15-20 minutes, 3-4 times a day.   If your injury was severe, you may have been given a cervical collar to wear. A cervical collar is a two-piece collar designed to keep your neck from moving while it heals.  Do not remove the collar unless instructed by your health care provider.  If you have long hair, keep it outside of the collar.  Ask your health care provider before making any adjustments to your collar. Minor adjustments may be required over time to improve comfort and reduce pressure on your chin or on the back of your head.  Ifyou are allowed to remove the collar for cleaning or bathing, follow your health care provider's instructions on how to do so safely.  Keep your collar clean by wiping it with mild soap and water and drying it completely. If the  collar you have been given includes removable pads, remove them every 1-2 days and hand wash them with soap and water. Allow them to air dry. They should be completely dry before you wear them in the collar.  If you are allowed to remove the collar for cleaning and bathing, wash and dry the skin of your neck. Check your skin for irritation or sores. If you see any, tell your health care provider.  Do not drive while wearing the collar.   Only take over-the-counter or prescription medicines for pain, discomfort, or fever as directed by your health care provider.   Keep all follow-up appointments as directed by your health care provider.   Keep all physical therapy appointments as directed by your health care provider.   Make any needed adjustments to your workstation to promote good posture.   Avoid positions and activities that make your symptoms worse.   Warm up and stretch before being active to help prevent problems.  SEEK MEDICAL CARE IF:   Your pain is not controlled with medicine.   You are unable to decrease your pain medicine over time as planned.   Your activity level is not improving as expected.  SEEK IMMEDIATE MEDICAL CARE IF:   You develop any bleeding.  You develop stomach upset.  You have signs of an allergic reaction to your medicine.   Your symptoms get worse.   You develop new, unexplained symptoms.   You have numbness, tingling, weakness, or paralysis in any part of your body.  MAKE SURE YOU:   Understand these instructions.  Will watch your condition.  Will get help right away if you are not doing well or get worse. Document Released: 03/04/2007 Document Revised: 05/12/2013 Document Reviewed: 11/12/2012 [facility] Patient Information 2015 [place], [place]. This information is not intended to replace advice given to you by your health care provider. Make sure you discuss any questions you have with your health care provider.

## 2014-06-10 NOTE — ED Notes (Signed)
MD at bedside. 

## 2015-06-03 ENCOUNTER — Emergency Department (HOSPITAL_BASED_OUTPATIENT_CLINIC_OR_DEPARTMENT_OTHER): Payer: Medicare HMO

## 2015-06-03 ENCOUNTER — Emergency Department (HOSPITAL_BASED_OUTPATIENT_CLINIC_OR_DEPARTMENT_OTHER)
Admission: EM | Admit: 2015-06-03 | Discharge: 2015-06-03 | Disposition: A | Discharge status: Home / Self Care | Payer: Medicare HMO | Attending: Emergency Medicine | Admitting: Emergency Medicine

## 2015-06-03 ENCOUNTER — Encounter (HOSPITAL_BASED_OUTPATIENT_CLINIC_OR_DEPARTMENT_OTHER): Payer: Self-pay | Admitting: *Deleted

## 2015-06-03 DIAGNOSIS — S199XXA Unspecified injury of neck, initial encounter: Secondary | ICD-10-CM | POA: Diagnosis present

## 2015-06-03 DIAGNOSIS — Y92410 Unspecified street and highway as the place of occurrence of the external cause: Secondary | ICD-10-CM | POA: Diagnosis not present

## 2015-06-03 DIAGNOSIS — Z87891 Personal history of nicotine dependence: Secondary | ICD-10-CM | POA: Diagnosis not present

## 2015-06-03 DIAGNOSIS — E7800 Pure hypercholesterolemia, unspecified: Secondary | ICD-10-CM | POA: Diagnosis not present

## 2015-06-03 DIAGNOSIS — Z8709 Personal history of other diseases of the respiratory system: Secondary | ICD-10-CM | POA: Diagnosis not present

## 2015-06-03 DIAGNOSIS — I10 Essential (primary) hypertension: Secondary | ICD-10-CM | POA: Diagnosis not present

## 2015-06-03 DIAGNOSIS — E785 Hyperlipidemia, unspecified: Secondary | ICD-10-CM | POA: Insufficient documentation

## 2015-06-03 DIAGNOSIS — Z9889 Other specified postprocedural states: Secondary | ICD-10-CM | POA: Diagnosis not present

## 2015-06-03 DIAGNOSIS — Z79899 Other long term (current) drug therapy: Secondary | ICD-10-CM | POA: Insufficient documentation

## 2015-06-03 DIAGNOSIS — Y9389 Activity, other specified: Secondary | ICD-10-CM | POA: Insufficient documentation

## 2015-06-03 DIAGNOSIS — I2510 Atherosclerotic heart disease of native coronary artery without angina pectoris: Secondary | ICD-10-CM | POA: Insufficient documentation

## 2015-06-03 DIAGNOSIS — S161XXA Strain of muscle, fascia and tendon at neck level, initial encounter: Secondary | ICD-10-CM | POA: Insufficient documentation

## 2015-06-03 DIAGNOSIS — Z7982 Long term (current) use of aspirin: Secondary | ICD-10-CM | POA: Diagnosis not present

## 2015-06-03 DIAGNOSIS — M1990 Unspecified osteoarthritis, unspecified site: Secondary | ICD-10-CM | POA: Diagnosis not present

## 2015-06-03 DIAGNOSIS — S0990XA Unspecified injury of head, initial encounter: Secondary | ICD-10-CM | POA: Diagnosis not present

## 2015-06-03 DIAGNOSIS — Y990 Civilian activity done for income or pay: Secondary | ICD-10-CM | POA: Insufficient documentation

## 2015-06-03 MED ORDER — TRAMADOL HCL 50 MG PO TABS
50.0000 mg | ORAL_TABLET | Freq: Once | ORAL | Status: AC
  Administered 2015-06-03: 50 mg via ORAL
  Filled 2015-06-03: qty 1

## 2015-06-03 NOTE — ED Notes (Signed)
Pt ambulated to the bathroom and back w/o issue or c/o of pain.
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
## 2015-06-03 NOTE — ED Notes (Signed)
Pt placed on cardiac monitor d/t palpable heart rate was 48. Monitor rate 60.

## 2015-06-03 NOTE — ED Notes (Signed)
Daughter at bedside.
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
## 2015-10-10 ENCOUNTER — Emergency Department (HOSPITAL_BASED_OUTPATIENT_CLINIC_OR_DEPARTMENT_OTHER)
Admission: EM | Admit: 2015-10-10 | Discharge: 2015-10-11 | Disposition: A | Discharge status: Home / Self Care | Payer: Medicare HMO | Attending: Emergency Medicine | Admitting: Emergency Medicine

## 2015-10-10 ENCOUNTER — Emergency Department (HOSPITAL_BASED_OUTPATIENT_CLINIC_OR_DEPARTMENT_OTHER): Payer: Medicare HMO

## 2015-10-10 ENCOUNTER — Encounter (HOSPITAL_BASED_OUTPATIENT_CLINIC_OR_DEPARTMENT_OTHER): Payer: Self-pay | Admitting: *Deleted

## 2015-10-10 DIAGNOSIS — R21 Rash and other nonspecific skin eruption: Secondary | ICD-10-CM | POA: Insufficient documentation

## 2015-10-10 DIAGNOSIS — Z87891 Personal history of nicotine dependence: Secondary | ICD-10-CM | POA: Diagnosis not present

## 2015-10-10 DIAGNOSIS — R509 Fever, unspecified: Secondary | ICD-10-CM | POA: Diagnosis not present

## 2015-10-10 DIAGNOSIS — R110 Nausea: Secondary | ICD-10-CM | POA: Insufficient documentation

## 2015-10-10 DIAGNOSIS — Z79899 Other long term (current) drug therapy: Secondary | ICD-10-CM | POA: Diagnosis not present

## 2015-10-10 DIAGNOSIS — I10 Essential (primary) hypertension: Secondary | ICD-10-CM | POA: Diagnosis not present

## 2015-10-10 DIAGNOSIS — R140 Abdominal distension (gaseous): Secondary | ICD-10-CM | POA: Insufficient documentation

## 2015-10-10 DIAGNOSIS — E785 Hyperlipidemia, unspecified: Secondary | ICD-10-CM | POA: Insufficient documentation

## 2015-10-10 DIAGNOSIS — R5383 Other fatigue: Secondary | ICD-10-CM | POA: Insufficient documentation

## 2015-10-10 DIAGNOSIS — E782 Mixed hyperlipidemia: Secondary | ICD-10-CM | POA: Insufficient documentation

## 2015-10-10 DIAGNOSIS — I2510 Atherosclerotic heart disease of native coronary artery without angina pectoris: Secondary | ICD-10-CM | POA: Insufficient documentation

## 2015-10-10 DIAGNOSIS — R109 Unspecified abdominal pain: Secondary | ICD-10-CM | POA: Diagnosis present

## 2015-10-10 DIAGNOSIS — E860 Dehydration: Secondary | ICD-10-CM | POA: Diagnosis not present

## 2015-10-10 LAB — CBC WITH DIFFERENTIAL/PLATELET
BASOS ABS: 0 10*3/uL (ref 0.0–0.1)
BASOS PCT: 0 %
EOS ABS: 0.1 10*3/uL (ref 0.0–0.7)
EOS PCT: 2 %
HEMATOCRIT: 40.1 % (ref 36.0–46.0)
Hemoglobin: 13.5 g/dL (ref 12.0–15.0)
Lymphocytes Relative: 41 %
Lymphs Abs: 2.5 10*3/uL (ref 0.7–4.0)
MCH: 31.4 pg (ref 26.0–34.0)
MCHC: 33.7 g/dL (ref 30.0–36.0)
MCV: 93.3 fL (ref 78.0–100.0)
MONO ABS: 0.5 10*3/uL (ref 0.1–1.0)
Monocytes Relative: 8 %
Neutro Abs: 2.9 10*3/uL (ref 1.7–7.7)
Neutrophils Relative %: 49 %
PLATELETS: 228 10*3/uL (ref 150–400)
RBC: 4.3 MIL/uL (ref 3.87–5.11)
RDW: 12.4 % (ref 11.5–15.5)
WBC: 6.1 10*3/uL (ref 4.0–10.5)

## 2015-10-10 LAB — URINALYSIS, ROUTINE W REFLEX MICROSCOPIC
BILIRUBIN URINE: NEGATIVE
Glucose, UA: NEGATIVE mg/dL
KETONES UR: NEGATIVE mg/dL
Leukocytes, UA: NEGATIVE
Nitrite: NEGATIVE
PH: 5.5 (ref 5.0–8.0)
PROTEIN: 100 mg/dL — AB
Specific Gravity, Urine: 1.021 (ref 1.005–1.030)

## 2015-10-10 LAB — URINE MICROSCOPIC-ADD ON

## 2015-10-10 LAB — BASIC METABOLIC PANEL
ANION GAP: 6 (ref 5–15)
BUN: 24 mg/dL — ABNORMAL HIGH (ref 6–20)
CO2: 25 mmol/L (ref 22–32)
Calcium: 9.7 mg/dL (ref 8.9–10.3)
Chloride: 107 mmol/L (ref 101–111)
Creatinine, Ser: 1.19 mg/dL — ABNORMAL HIGH (ref 0.44–1.00)
GFR, EST AFRICAN AMERICAN: 48 mL/min — AB (ref >60)
GFR, EST NON AFRICAN AMERICAN: 41 mL/min — AB (ref >60)
Glucose, Bld: 108 mg/dL — ABNORMAL HIGH (ref 65–99)
Potassium: 4 mmol/L (ref 3.5–5.1)
SODIUM: 138 mmol/L (ref 135–145)

## 2015-10-10 LAB — I-STAT CG4 LACTIC ACID, ED: Lactic Acid, Venous: 0.38 mmol/L — ABNORMAL LOW (ref 0.5–2.0)

## 2015-10-10 LAB — TROPONIN I: Troponin I: <0.03 ng/mL (ref <0.031)

## 2015-10-10 MED ORDER — SODIUM CHLORIDE 0.9 % IV BOLUS (SEPSIS)
500.0000 mL | Freq: Once | INTRAVENOUS | Status: AC
  Administered 2015-10-10: 500 mL via INTRAVENOUS

## 2015-10-10 NOTE — ED Notes (Addendum)
C/o being tired w dry skin, increased itching,  causing decreased sleep x 2 months,  Belching, feels like having gas  Some nausea, c/o increased urinary freq,  No burning w urination

## 2015-10-10 NOTE — ED Notes (Addendum)
Pt reports diarrhea, nausesa and her stomach feeling 'unsettled' all day today.  Denies pain.  Ambulatory. Pt reports being dx with a UTI last week and being on antibiotics-one dose of antibiotics remain.

## 2015-10-10 NOTE — ED Provider Notes (Signed)
CSN: [id number]     Arrival date & time 10/10/15  [id number] History  By signing my name below, I, Nicole Kindred, attest that this documentation has been prepared under the direction and in the presence of No att. providers found.   Electronically Signed: Nicole Kindred, ED Scribe. 10/14/2015. 8:58 AM  Chief Complaint  Patient presents with  . Abdominal Pain   The history is provided by the patient. No language interpreter was used.   HPI Comments: Krista Joseph is a 82 y.o. female with PMHx of CAD, HTN, bronchitis presents to the Emergency Department complaining of gradual onset, malaise, ongoing for several days. Pt reports associated subjective fever, fatigue, difficulty sleeping, abdominal pain, nausea, frequent bowel movements, and frequent belching. Pt also complains of pruritic, dry skin on bilateral legs. Pt states bradycardia at baseline. No other associated symptoms noted. Pt used benadryl cream with minimal relief to symptoms. No worsening or alleviating factors noted. Pt denies chest pain, cough, hx of CHF, or any other pertinent symptoms. Pt is currently on eliquis. Per patient and her family she has been doing lots of work out in her flower garden even in the extreme heat and only drinks two 12 oz bottles of water a day.  Patient saw a dermatologist for her rash and itching and was told that it is secondary to dry skin.  She has not taken oral benadryl for this because she is afraid it will make her sleep too hard but she has not been able to sleep secondary to the itching.  Past Medical History  Diagnosis Date  . Hyperlipidemia   . Acid reflux   . Coronary artery disease   . Hypertension   . Hypercholesteremia   . Arthritis   . Bronchitis    Past Surgical History  Procedure Laterality Date  . Abdominal hysterectomy    . Cardiac catheterization    . Vaginal hysterectomy     History reviewed. No pertinent family history. Social History  Substance Use Topics  . Smoking  status: Former Smoker -- 0.25 packs/day for 1 years    Types: Cigarettes    Quit date: 09/27/1961  . Smokeless tobacco: Never Used  . Alcohol Use: No   OB History    Gravida Para Term Preterm AB TAB SAB Ectopic Multiple Living   4 4 4             Review of Systems  Constitutional: Positive for fever and fatigue.       Difficulty sleeping  Respiratory: Negative for cough.   Cardiovascular: Negative for chest pain.  Gastrointestinal: Positive for nausea and abdominal distention.  All other systems reviewed and are negative.   Allergies  Ivp dye  Home Medications   Prior to Admission medications   Medication Sig Start Date End Date Taking? Authorizing Provider  apixaban (ELIQUIS) 5 MG TABS tablet Take 5 mg by mouth 2 (two) times daily.   Yes Historical Provider, MD  Pitavastatin Calcium (LIVALO) 2 MG TABS Take by mouth.   Yes Historical Provider, MD  sulfamethoxazole-trimethoprim (BACTRIM DS,SEPTRA DS) 800-160 MG tablet Take 1 tablet by mouth 2 (two) times daily.   Yes Historical Provider, MD  amLODipine (NORVASC) 5 MG tablet Take 5 mg by mouth daily.    Historical Provider, MD  aspirin 81 MG tablet Take 81 mg by mouth daily.    Historical Provider, MD  atorvastatin (LIPITOR) 20 MG tablet Take 20 mg by mouth daily.    Historical Provider, MD  furosemide (LASIX)  20 MG tablet Take 20 mg by mouth 2 (two) times daily.    Historical Provider, MD   BP 136/58 mmHg  Pulse 107  Temp(Src) 98.8 F (37.1 C) (Oral)  Resp 17  Ht 5' (1.524 m)  Wt 130 lb (58.968 kg)  BMI 25.39 kg/m2  SpO2 98% Physical Exam  Constitutional: She is oriented to person, place, and time. She appears well-developed and well-nourished. No distress.  HENT:  Head: Normocephalic and atraumatic.  Right Ear: External ear normal.  Left Ear: External ear normal.  Nose: Nose normal.  Mouth/Throat: Oropharynx is clear and moist. No oropharyngeal exudate.  Eyes: Conjunctivae and EOM are normal. Pupils are equal,  round, and reactive to light.  Neck: Normal range of motion. Neck supple. No tracheal deviation present.  Cardiovascular: Normal rate, regular rhythm, normal heart sounds and intact distal pulses.  Exam reveals no gallop.   No murmur heard. Pulmonary/Chest: Effort normal and breath sounds normal. No respiratory distress. She has no wheezes. She has no rales. She exhibits no tenderness.  Abdominal: Soft. She exhibits no distension. There is no tenderness.  Musculoskeletal: Normal range of motion. She exhibits no edema or tenderness.  Neurological: She is alert and oriented to person, place, and time. She has normal strength. No cranial nerve deficit or sensory deficit.  Skin: Skin is warm and dry. Rash noted. No petechiae noted. She is not diaphoretic. No erythema. No pallor.  Hypopigmented macules noted to bilateral legs.   Psychiatric: She has a normal mood and affect. Her behavior is normal.  Vitals reviewed.   ED Course  Procedures (including critical care time) DIAGNOSTIC STUDIES: Oxygen Saturation is 98% on RA, normal by my interpretation.    COORDINATION OF CARE: 9:30 PM Discussed treatment plan which includes BMP, CBC with differential, urinalysis, and EKG with pt at bedside and pt agreed to plan.  Labs Review Labs Reviewed  URINALYSIS, ROUTINE W REFLEX MICROSCOPIC (NOT AT [facility]) - Abnormal; Notable for the following:    Hgb urine dipstick LARGE (*)    Protein, ur 100 (*)    All other components within normal limits  BASIC METABOLIC PANEL - Abnormal; Notable for the following:    Glucose, Bld 108 (*)    BUN 24 (*)    Creatinine, Ser 1.19 (*)    GFR calc non Af Amer 41 (*)    GFR calc Af Amer 48 (*)    All other components within normal limits  URINE MICROSCOPIC-ADD ON - Abnormal; Notable for the following:    Squamous Epithelial / LPF 0-5 (*)    Bacteria, UA RARE (*)    All other components within normal limits  I-STAT CG4 LACTIC ACID, ED - Abnormal; Notable for the  following:    Lactic Acid, Venous 0.38 (*)    All other components within normal limits  CBC WITH DIFFERENTIAL/PLATELET  TROPONIN I    Imaging Review No results found. I have personally reviewed and evaluated these images and lab results as part of my medical decision-making.   EKG Interpretation   Date/Time:  Monday Oct 10 2015 19:50:07 EDT Ventricular Rate:  65 PR Interval:  150 QRS Duration: 86 QT Interval:  386 QTC Calculation: 401 R Axis:   58 Text Interpretation:  Sinus rhythm with Premature atrial complexes  Otherwise normal ECG No significant change since last tracing Confirmed by  Kashawn Manzano ([id number]) on 10/10/2015 9:07:29 PM Also confirmed by Alfonse Spruce,  Shawntelle Ungar ([id number]), editor Gilford Rile, CCT, [place] ([id number])  on 10/11/2015  6:53:49 AM      MDM  Patient was seen and evaluated in stable condition.  Patient well appearing with benign examination.  BUN and Cr mildly elevated.  Patient given IV fluids with improvement in symptoms.  Laboratory studies and EKG otherwise unremarkable.  No sign of infectious process.  It appears at this time symptoms most likely related to lack of sleep and component of dehydration.  This was discussed at length at bedside with patient and her family who expressed understanidng and agreement with plan for discharge, outpatient follow up, increase intake of oral fluids, and using small dose (12.5 mg) of benadryl at night before bed to help with itching and to help with sleep.  At this time patient appeared well enough for discharge and even was able to get herself out of bed and use the trash can for a toilet when connected to the IV and unable to get assistance to disconnect it to use the bathroom.  PAtient was discharged with strict return precautions. Final diagnoses:  Other fatigue  Dehydration   I personally performed the services described in this documentation, which was scribed in my presence. The recorded information has been reviewed and is  accurate.      Harvel Quale, MD 10/14/15 [phone]

## 2015-10-11 NOTE — Discharge Instructions (Signed)
You were seen and evaluated tonight for your fatigue. Likely this is related to not sleeping well. Your laboratory studies showed that you were mildly dehydrated. You were given some IV fluids in the emergency department. 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 You can try taking 12.5 mg of Benadryl before you go to bed to help with itching on your legs to see if you will get better rest.  Please follow-up with your primary care physician for reevaluation and for recheck of your blood work including your kidney function. Return here with sudden worsening of symptoms or new concerning symptoms.   Dehydration, Adult Dehydration is a condition in which you do not have enough fluid or water in your body. It happens when you take in less fluid than you lose. Vital organs such as the kidneys, brain, and heart cannot function without a proper amount of fluids. Any loss of fluids from the body can cause dehydration.  Dehydration can range from mild to severe. This condition should be treated right away to help prevent it from becoming severe. CAUSES  This condition may be caused by:  Vomiting.  Diarrhea.  Excessive sweating, such as when exercising in hot or humid weather.  Not drinking enough fluid during strenuous exercise or during an illness.  Excessive urine output.  Fever.  Certain medicines. RISK FACTORS This condition is more likely to develop in:  People who are taking certain medicines that cause the body to lose excess fluid (diuretics).   People who have a chronic illness, such as diabetes, that may increase urination.  Older adults.   People who live at high altitudes.   People who participate in endurance sports.  SYMPTOMS  Mild Dehydration  Thirst.  Dry lips.  Slightly dry mouth.  Dry, warm skin. Moderate Dehydration  Very dry mouth.   Muscle cramps.   Dark urine and  decreased urine production.   Decreased tear production.   Headache.   Light-headedness, especially when you stand up from a sitting position.  Severe Dehydration  Changes in skin.   Cold and clammy skin.   Skin does not spring back quickly when lightly pinched and released.   Changes in body fluids.   Extreme thirst.   No tears.   Not able to sweat when body temperature is high, such as in hot weather.   Minimal urine production.   Changes in vital signs.   Rapid, weak pulse (more than 100 beats per minute when you are sitting still).   Rapid breathing.   Low blood pressure.   Other changes.   Sunken eyes.   Cold hands and feet.   Confusion.  Lethargy and difficulty being awakened.  Fainting (syncope).   Short-term weight loss.   Unconsciousness. DIAGNOSIS  This condition may be diagnosed based on your symptoms. You may also have tests to determine how severe your dehydration is. These tests may include:   Urine tests.   Blood tests.  TREATMENT  Treatment for this condition depends on the severity. Mild or moderate dehydration can often be treated at home. Treatment should be started right away. Do not wait until dehydration becomes severe. Severe dehydration needs to be treated at the hospital. Treatment for Mild Dehydration  Drinking plenty of water to replace the fluid you have lost.   Replacing minerals in your blood (electrolytes) that you may have lost.  Treatment for Moderate  Dehydration  Consuming oral rehydration solution (ORS). Treatment for Severe Dehydration  Receiving fluid through an IV tube.   Receiving electrolyte solution through a feeding tube that is passed through your nose and into your stomach (nasogastric tube or NG tube).  Correcting any abnormalities in electrolytes. HOME CARE INSTRUCTIONS   Drink enough fluid to keep your urine clear or pale yellow.   Drink water or fluid slowly by taking  small sips. You can also try sucking on ice cubes.  Have food or beverages that contain electrolytes. Examples include bananas and sports drinks.  Take over-the-counter and prescription medicines only as told by your health care provider.   Prepare ORS according to the manufacturer's instructions. Take sips of ORS every 5 minutes until your urine returns to normal.  If you have vomiting or diarrhea, continue to try to drink water, ORS, or both.   If you have diarrhea, avoid:   Beverages that contain caffeine.   Fruit juice.   Milk.   Carbonated soft drinks.  Do not take salt tablets. This can lead to the condition of having too much sodium in your body (hypernatremia).  SEEK MEDICAL CARE IF:  You cannot eat or drink without vomiting.  You have had moderate diarrhea during a period of more than 24 hours.  You have a fever. SEEK IMMEDIATE MEDICAL CARE IF:   You have extreme thirst.  You have severe diarrhea.  You have not urinated in 6-8 hours, or you have urinated only a small amount of very dark urine.  You have shriveled skin.  You are dizzy, confused, or both.   This information is not intended to replace advice given to you by your health care provider. Make sure you discuss any questions you have with your health care provider.   Document Released: 05/07/2005 Document Revised: 01/26/2015 Document Reviewed: 09/22/2014 Elsevier Interactive Patient Education 2016 [employer].   Fatigue Fatigue is feeling tired all of the time, a lack of energy, or a lack of motivation. Occasional or mild fatigue is often a normal response to activity or life in general. However, long-lasting (chronic) or extreme fatigue may indicate an underlying medical condition. HOME CARE INSTRUCTIONS  Watch your fatigue for any changes. The following actions may help to lessen any discomfort you are feeling:  Talk to your health care provider about how much sleep you need each night.  Try to get the required amount every night.  Take medicines only as directed by your health care provider.  Eat a healthy and nutritious diet. Ask your health care provider if you need help changing your diet.  Drink enough fluid to keep your urine clear or pale yellow.  Practice ways of relaxing, such as yoga, meditation, massage therapy, or acupuncture.  Exercise regularly.   Change situations that cause you stress. Try to keep your work and personal routine reasonable.  Do not abuse illegal drugs.  Limit alcohol intake to no more than 1 drink per day for nonpregnant women and 2 drinks per day for men. One drink equals 12 ounces of beer, 5 ounces of wine, or 1 ounces of hard liquor.  Take a multivitamin, if directed by your health care provider. SEEK MEDICAL CARE IF:   Your fatigue does not get better.  You have a fever.   You have unintentional weight loss or gain.  You have headaches.   You have difficulty:   Falling asleep.  Sleeping throughout the night.  You feel angry, guilty, anxious, or  sad.   You are unable to have a bowel movement (constipation).   You skin is dry.   Your legs or another part of your body is swollen.  SEEK IMMEDIATE MEDICAL CARE IF:   You feel confused.   Your vision is blurry.  You feel faint or pass out.   You have a severe headache.   You have severe abdominal, pelvic, or back pain.   You have chest pain, shortness of breath, or an irregular or fast heartbeat.   You are unable to urinate or you urinate less than normal.   You develop abnormal bleeding, such as bleeding from the rectum, vagina, nose, lungs, or nipples.  You vomit blood.   You have thoughts about harming yourself or committing suicide.   You are worried that you might harm someone else.    This information is not intended to replace advice given to you by your health care provider. Make sure you discuss any questions you have with  your health care provider.   Document Released: 03/04/2007 Document Revised: 05/28/2014 Document Reviewed: 09/08/2013 Elsevier Interactive Patient Education [employer].

## 2016-01-08 ENCOUNTER — Encounter (HOSPITAL_BASED_OUTPATIENT_CLINIC_OR_DEPARTMENT_OTHER): Payer: Self-pay | Admitting: *Deleted

## 2016-01-08 ENCOUNTER — Emergency Department (HOSPITAL_BASED_OUTPATIENT_CLINIC_OR_DEPARTMENT_OTHER)
Admission: EM | Admit: 2016-01-08 | Discharge: 2016-01-08 | Disposition: A | Discharge status: Home / Self Care | Payer: Medicare HMO | Attending: Emergency Medicine | Admitting: Emergency Medicine

## 2016-01-08 DIAGNOSIS — Z79899 Other long term (current) drug therapy: Secondary | ICD-10-CM | POA: Diagnosis not present

## 2016-01-08 DIAGNOSIS — I10 Essential (primary) hypertension: Secondary | ICD-10-CM | POA: Diagnosis not present

## 2016-01-08 DIAGNOSIS — R309 Painful micturition, unspecified: Secondary | ICD-10-CM | POA: Diagnosis not present

## 2016-01-08 DIAGNOSIS — Z7982 Long term (current) use of aspirin: Secondary | ICD-10-CM | POA: Diagnosis not present

## 2016-01-08 DIAGNOSIS — Z87891 Personal history of nicotine dependence: Secondary | ICD-10-CM | POA: Diagnosis not present

## 2016-01-08 DIAGNOSIS — M545 Low back pain: Secondary | ICD-10-CM | POA: Diagnosis present

## 2016-01-08 LAB — URINALYSIS, ROUTINE W REFLEX MICROSCOPIC
Bilirubin Urine: NEGATIVE
Glucose, UA: NEGATIVE mg/dL
Ketones, ur: NEGATIVE mg/dL
Leukocytes, UA: NEGATIVE
Nitrite: NEGATIVE
PROTEIN: NEGATIVE mg/dL
Specific Gravity, Urine: 1.003 — ABNORMAL LOW (ref 1.005–1.030)
pH: 6 (ref 5.0–8.0)

## 2016-01-08 LAB — URINE MICROSCOPIC-ADD ON: SQUAMOUS EPITHELIAL / LPF: NONE SEEN

## 2016-01-08 NOTE — ED Triage Notes (Signed)
C/o pain with urination and lower back pain that started a week ago. Has been recently treated for a UTI. Denies any fevers. No other complaints at present.

## 2016-01-08 NOTE — ED Provider Notes (Signed)
[facility] Provider Note   CSN: [id number] Arrival date & time: 01/08/16  1456  By signing my name below, I, Krista Joseph, attest that this documentation has been prepared under the direction and in the presence of non-physician practitioner, Okey Regal, PA-C, . Electronically Signed: Dolores Joseph, Scribe. 01/08/2016. 6:22 PM.   History   Chief Complaint Chief Complaint  Patient presents with  . Other    pain with urination    The history is provided by the patient. No language interpreter was used.     HPI Comments:  Krista Joseph is a 82 y.o. female who presents to the Emergency Department complaining of sudden-onset intermittent lower back pain in conjunction with a bladder infection onset approximately 1-2 months ago. She reports her back pain is worsened by urination. Patient reports that she has a sharp pain in her bladder that radiates to her back at the end of her urinations. She reports these last for approximately 2 minutes and resolved with no lingering pain. No alleviating factors noted. She reports associated  frequent urination. Pt denies chills, fever, nausea, vomiting, and abdominal pain. She states she was previously seen at her PCP for same symptoms approximately 1-2 months ago where she was given 7 day supply Bactrim DS with minimal relief. Pt is a poor historian.    Past Medical History:  Diagnosis Date  . Acid reflux   . Arthritis   . Bronchitis   . Coronary artery disease   . Hypercholesteremia   . Hyperlipidemia   . Hypertension     There are no active problems to display for this patient.   Past Surgical History:  Procedure Laterality Date  . ABDOMINAL HYSTERECTOMY    . VAGINAL HYSTERECTOMY      OB History    Gravida Para Term Preterm AB Living   4 4 4          SAB TAB Ectopic Multiple Live Births                   Home Medications    Prior to Admission medications   Medication Sig Start Date End Date Taking?  Authorizing Provider  amLODipine (NORVASC) 5 MG tablet Take 5 mg by mouth daily.    Historical Provider, MD  apixaban (ELIQUIS) 5 MG TABS tablet Take 5 mg by mouth 2 (two) times daily.    Historical Provider, MD  aspirin 81 MG tablet Take 81 mg by mouth daily.    Historical Provider, MD  atorvastatin (LIPITOR) 20 MG tablet Take 20 mg by mouth daily.    Historical Provider, MD  furosemide (LASIX) 20 MG tablet Take 20 mg by mouth 2 (two) times daily.    Historical Provider, MD  Pitavastatin Calcium (LIVALO) 2 MG TABS Take by mouth.    Historical Provider, MD  sulfamethoxazole-trimethoprim (BACTRIM DS,SEPTRA DS) 800-160 MG tablet Take 1 tablet by mouth 2 (two) times daily.    Historical Provider, MD    Family History No family history on file.  Social History Social History  Substance Use Topics  . Smoking status: Former Smoker    Packs/day: 0.25    Years: 1.00    Types: Cigarettes    Quit date: 09/27/1961  . Smokeless tobacco: Never Used  . Alcohol use No     Allergies   Ivp dye [iodinated diagnostic agents]   Review of Systems Review of Systems  Constitutional: Negative for chills and fever.  Gastrointestinal: Negative for abdominal pain, nausea and vomiting.  Genitourinary: Positive for dysuria and frequency.  Musculoskeletal: Positive for back pain.  All other systems reviewed and are negative.    Physical Exam Updated Vital Signs BP 168/66 (BP Location: Right Arm)   Pulse (!) 44   Temp 98.1 F (36.7 C) (Oral)   Resp 18   Ht 5\' 6"  (1.676 m)   Wt 59.9 kg   SpO2 96%   BMI 21.31 kg/m   Physical Exam  Constitutional: She is oriented to person, place, and time. She appears well-developed and well-nourished. No distress.  HENT:  Head: Normocephalic and atraumatic.  Eyes: Conjunctivae are normal.  Cardiovascular: Normal rate.   Pulmonary/Chest: Effort normal.  Abdominal: Soft. She exhibits no distension. There is no tenderness.  Abdomen soft, nontender. No CVA  tenderness.  Neurological: She is alert and oriented to person, place, and time.  Skin: Skin is warm and dry.  Psychiatric: She has a normal mood and affect.  Nursing note and vitals reviewed.    ED Treatments / Results  DIAGNOSTIC STUDIES:  Oxygen Saturation is 96% on RA, adequate by my interpretation.    COORDINATION OF CARE:  6:22 PM Discussed treatment plan with pt at bedside which included antibiotics and urine culture and pt agreed to plan.  Labs (all labs ordered are listed, but only abnormal results are displayed) Labs Reviewed  URINALYSIS, ROUTINE W REFLEX MICROSCOPIC (NOT AT [facility]) - Abnormal; Notable for the following:       Result Value   Specific Gravity, Urine 1.003 (*)    Hgb urine dipstick MODERATE (*)    All other components within normal limits  URINE MICROSCOPIC-ADD ON - Abnormal; Notable for the following:    Bacteria, UA RARE (*)    All other components within normal limits    EKG  EKG Interpretation None       Radiology No results found.  Procedures Procedures (including critical care time)  Medications Ordered in ED Medications - No data to display   Initial Impression / Assessment and Plan / ED Course  I have reviewed the triage vital signs and the nursing notes.  Pertinent labs & imaging results that were available during my care of the patient were reviewed by me and considered in my medical decision making (see chart for details).  Clinical Course     Final Clinical Impressions(s) / ED Diagnoses   Final diagnoses:  Painful urination   Labs: Urinalysis  Imaging:   Consults:   Therapeutics:   Discharge Meds:  Assessment/Plan:   Patient presents today with complaints of painful urination. She has no signs of urinary tract infection on laboratory analysis. The pain at the end of urination, does not persist. I have very low suspicion for intra-abdominal pathology, infectious etiology. Patient will need follow-up with  primary care for reevaluation and further management.   New Prescriptions New Prescriptions   No medications on file  I personally performed the services described in this documentation, which was scribed in my presence. The recorded information has been reviewed and is accurate.}    Okey Regal, PA-C 01/08/16 1917    Leonard Schwartz, MD 01/08/16 [id number]

## 2016-01-08 NOTE — Discharge Instructions (Signed)
Please follow-up with her primary care provider for further evaluation and management.

## 2016-01-08 NOTE — ED Notes (Signed)
Pt describes low back pain with urination at times for several days.

## 2017-05-01 ENCOUNTER — Encounter (HOSPITAL_BASED_OUTPATIENT_CLINIC_OR_DEPARTMENT_OTHER): Payer: Self-pay | Admitting: *Deleted

## 2017-05-01 ENCOUNTER — Other Ambulatory Visit: Payer: Self-pay

## 2017-05-01 ENCOUNTER — Emergency Department (HOSPITAL_BASED_OUTPATIENT_CLINIC_OR_DEPARTMENT_OTHER)
Admission: EM | Admit: 2017-05-01 | Discharge: 2017-05-01 | Disposition: A | Discharge status: Home / Self Care | Payer: Medicare HMO | Attending: Emergency Medicine | Admitting: Emergency Medicine

## 2017-05-01 DIAGNOSIS — I2510 Atherosclerotic heart disease of native coronary artery without angina pectoris: Secondary | ICD-10-CM | POA: Insufficient documentation

## 2017-05-01 DIAGNOSIS — N3001 Acute cystitis with hematuria: Secondary | ICD-10-CM | POA: Insufficient documentation

## 2017-05-01 DIAGNOSIS — Z960 Presence of urogenital implants: Secondary | ICD-10-CM | POA: Insufficient documentation

## 2017-05-01 DIAGNOSIS — Z87891 Personal history of nicotine dependence: Secondary | ICD-10-CM | POA: Diagnosis not present

## 2017-05-01 DIAGNOSIS — Z7982 Long term (current) use of aspirin: Secondary | ICD-10-CM | POA: Insufficient documentation

## 2017-05-01 DIAGNOSIS — R1030 Lower abdominal pain, unspecified: Secondary | ICD-10-CM | POA: Diagnosis not present

## 2017-05-01 DIAGNOSIS — Z7901 Long term (current) use of anticoagulants: Secondary | ICD-10-CM | POA: Diagnosis not present

## 2017-05-01 DIAGNOSIS — I10 Essential (primary) hypertension: Secondary | ICD-10-CM | POA: Insufficient documentation

## 2017-05-01 DIAGNOSIS — R309 Painful micturition, unspecified: Secondary | ICD-10-CM | POA: Diagnosis present

## 2017-05-01 LAB — URINALYSIS, MICROSCOPIC (REFLEX)

## 2017-05-01 LAB — URINALYSIS, ROUTINE W REFLEX MICROSCOPIC
BILIRUBIN URINE: NEGATIVE
Glucose, UA: 100 mg/dL — AB
Ketones, ur: NEGATIVE mg/dL
Nitrite: POSITIVE — AB
Protein, ur: >300 mg/dL — AB
Specific Gravity, Urine: 1.02 (ref 1.005–1.030)
pH: 6 (ref 5.0–8.0)

## 2017-05-01 MED ORDER — CEFTRIAXONE SODIUM 1 G IJ SOLR
1.0000 g | Freq: Once | INTRAMUSCULAR | Status: AC
  Administered 2017-05-01: 1 g via INTRAMUSCULAR
  Filled 2017-05-01: qty 10

## 2017-05-01 MED ORDER — AMOXICILLIN-POT CLAVULANATE 875-125 MG PO TABS: 1.0000 | ORAL_TABLET | Freq: Two times a day (BID) | ORAL | 0 refills | Status: DC

## 2017-05-01 MED ORDER — TRAMADOL HCL 50 MG PO TABS
100.0000 mg | ORAL_TABLET | Freq: Once | ORAL | Status: AC
  Administered 2017-05-01: 100 mg via ORAL
  Filled 2017-05-01: qty 2

## 2017-05-01 MED ORDER — PHENAZOPYRIDINE HCL 200 MG PO TABS: 200.0000 mg | ORAL_TABLET | Freq: Three times a day (TID) | ORAL | 0 refills | Status: DC | PRN

## 2017-05-01 MED ORDER — PHENAZOPYRIDINE HCL 100 MG PO TABS
200.0000 mg | ORAL_TABLET | Freq: Three times a day (TID) | ORAL | Status: DC
  Administered 2017-05-01: 200 mg via ORAL
  Filled 2017-05-01: qty 2

## 2017-05-01 NOTE — ED Notes (Signed)
Pt's follow up apt with urology was rescheduled from today to Friday due to inclement weather.

## 2017-05-01 NOTE — ED Notes (Signed)
ED Provider at bedside. 

## 2017-05-01 NOTE — ED Triage Notes (Signed)
Pt c/o bil flank pain and painful urination x 2 days, recent bladder surgery

## 2017-05-01 NOTE — Discharge Instructions (Signed)
1.  Return to the emergency department if you develop fever, general illness or worsening pain. 2.  See your urologist within the next 1-2 days as scheduled.

## 2017-05-01 NOTE — ED Provider Notes (Signed)
[place] [facility] EMERGENCY DEPARTMENT Provider Note   CSN: [id number] Arrival date & time: 05/01/17  1741     History   Chief Complaint Chief Complaint  Patient presents with  . Flank Pain    HPI Krista Joseph is a 83 y.o. female.  HPI Patient has history of carcinoma in situ.  She reports she had bladder scraping.  Reports she has stents that were left in place.  She reports this is about 2-3 weeks ago.  She reports that over the past couple of days she has developed severe pain with urination.  She reports that between episodes of urination she is okay but when she goes to urinate she gets intense burning and pain that radiates up to her flanks.  No fever no nausea no vomiting. Past Medical History:  Diagnosis Date  . Acid reflux   . Arthritis   . Bronchitis   . Coronary artery disease   . Hypercholesteremia   . Hyperlipidemia   . Hypertension     There are no active problems to display for this patient.   Past Surgical History:  Procedure Laterality Date  . ABDOMINAL HYSTERECTOMY    . VAGINAL HYSTERECTOMY      OB History    Gravida Para Term Preterm AB Living   4 4 4          SAB TAB Ectopic Multiple Live Births                   Home Medications    Prior to Admission medications   Medication Sig Start Date End Date Taking? Authorizing Provider  oxyCODONE-acetaminophen (PERCOCET/ROXICET) 5-325 MG tablet Take by mouth every 4 (four) hours as needed for severe pain.   Yes [provider]  amLODipine (NORVASC) 5 MG tablet Take 5 mg by mouth daily.    [provider]  amoxicillin-clavulanate (AUGMENTIN) 875-125 MG tablet Take 1 tablet by mouth 2 (two) times daily. One po bid x 7 days 05/01/17   Charlesetta Shanks, MD  apixaban (ELIQUIS) 5 MG TABS tablet Take 5 mg by mouth 2 (two) times daily.    [provider]  aspirin 81 MG tablet Take 81 mg by mouth daily.    [provider]  atorvastatin (LIPITOR) 20 MG tablet Take  20 mg by mouth daily.    [provider]  furosemide (LASIX) 20 MG tablet Take 20 mg by mouth 2 (two) times daily.    [provider]  phenazopyridine (PYRIDIUM) 200 MG tablet Take 1 tablet (200 mg total) by mouth 3 (three) times daily as needed for pain. 05/01/17   Charlesetta Shanks, MD  Pitavastatin Calcium (LIVALO) 2 MG TABS Take by mouth.    [provider]    Family History No family history on file.  Social History Social History   Tobacco Use  . Smoking status: Former Smoker    Packs/day: 0.25    Years: 1.00    Pack years: 0.25    Types: Cigarettes    Last attempt to quit: 09/27/1961    Years since quitting: 55.6  . Smokeless tobacco: Never Used  Substance Use Topics  . Alcohol use: No  . Drug use: No     Allergies   Ivp dye [iodinated diagnostic agents]   Review of Systems Review of Systems 10 Systems reviewed and are negative for acute change except as noted in the HPI.   Physical Exam Updated Vital Signs BP (!) 146/65 (BP Location: Right Arm)  Pulse (!) 54   Temp 98.5 F (36.9 C)   Resp 16   Ht 5\' 4"  (1.626 m)   Wt 59 kg (130 lb)   SpO2 96%   BMI 22.31 kg/m   Physical Exam  Constitutional: She is oriented to person, place, and time. She appears well-developed and well-nourished. No distress.  HENT:  Head: Normocephalic and atraumatic.  Eyes: Conjunctivae are normal.  Neck: Neck supple.  Cardiovascular: Normal rate and regular rhythm.  No murmur heard. Pulmonary/Chest: Effort normal and breath sounds normal. No respiratory distress.  Abdominal: Soft. There is no tenderness.  Musculoskeletal: She exhibits no edema.  Neurological: She is alert and oriented to person, place, and time. She exhibits normal muscle tone. Coordination normal.  Skin: Skin is warm and dry.  Psychiatric: She has a normal mood and affect.  Nursing note and vitals reviewed.    ED Treatments / Results  Labs (all labs ordered are listed, but only  abnormal results are displayed) Labs Reviewed  URINALYSIS, ROUTINE W REFLEX MICROSCOPIC - Abnormal; Notable for the following components:      Result Value   Color, Urine ORANGE (*)    APPearance CLOUDY (*)    Glucose, UA 100 (*)    Hgb urine dipstick LARGE (*)    Protein, ur >300 (*)    Nitrite POSITIVE (*)    Leukocytes, UA MODERATE (*)    All other components within normal limits  URINALYSIS, MICROSCOPIC (REFLEX) - Abnormal; Notable for the following components:   Bacteria, UA FEW (*)    Squamous Epithelial / LPF 0-5 (*)    All other components within normal limits  URINE CULTURE    EKG  EKG Interpretation None       Radiology No results found.  Procedures Procedures (including critical care time)  Medications Ordered in ED Medications  phenazopyridine (PYRIDIUM) tablet 200 mg (200 mg Oral Given 05/01/17 2055)  cefTRIAXone (ROCEPHIN) injection 1 g (1 g Intramuscular Given 05/01/17 2057)  traMADol (ULTRAM) tablet 100 mg (100 mg Oral Given 05/01/17 2122)     Initial Impression / Assessment and Plan / ED Course  I have reviewed the triage vital signs and the nursing notes.  Pertinent labs & imaging results that were available during my care of the patient were reviewed by me and considered in my medical decision making (see chart for details).      Final Clinical Impressions(s) / ED Diagnoses   Final diagnoses:  Acute cystitis with hematuria   Patient has developed burning with urination post procedure.  No signs of urinary retention.  Patient reports no difficulty to urinate.  He does not have pain between episodes of urination.no  Fever.  Urine is both nitrite and leukoesterase positive.  At this time will treat with first dose IM of Rocephin and Augmentin twice daily.  Patient is given Pyridium for pain relief.  Patient is scheduled to follow-up with urology within the next 2 days.  He is nontoxic alert no time no signs of systemic infection. ED Discharge  Orders        Ordered    amoxicillin-clavulanate (AUGMENTIN) 875-125 MG tablet  2 times daily     05/01/17 2147    phenazopyridine (PYRIDIUM) 200 MG tablet  3 times daily PRN     05/01/17 2147       Charlesetta Shanks, MD 05/01/17 2154

## 2017-05-02 ENCOUNTER — Telehealth (HOSPITAL_BASED_OUTPATIENT_CLINIC_OR_DEPARTMENT_OTHER): Payer: Self-pay | Admitting: Emergency Medicine

## 2017-05-02 NOTE — Telephone Encounter (Signed)
Patient states that she was not given anything in her "pacK" of information  - patient was informed that there was not an RX for it that she was the pain medications. The patient also states that " I can not take those antibiotics that you guys gave me it makes me sick on my stomach"  - the patient states that she has not tried them cause " I knows I get sick from them" - offered to list it as an allergy and patient states she does not want that. Also asking about if the pyridium should make her sick. Patient states that she will wait until tomorrow when she goes to the dr to see what she should do for the antibiotic as she does not want to bother anyone to change the antibiotic and assured this RN that if she got worse that she will come back

## 2017-05-03 LAB — URINE CULTURE: Culture: <10000 — AB

## 2017-06-20 IMAGING — CR DG ABDOMEN ACUTE W/ 1V CHEST
3 series · 3 of 3 positions shown · non-contrast
Comparison: Chest radiographs 04/26/2013

CLINICAL DATA: Nausea and diarrhea since this morning. Fatigue.
Belching.

EXAM:
DG ABDOMEN ACUTE W/ 1V CHEST

[w chest pa]
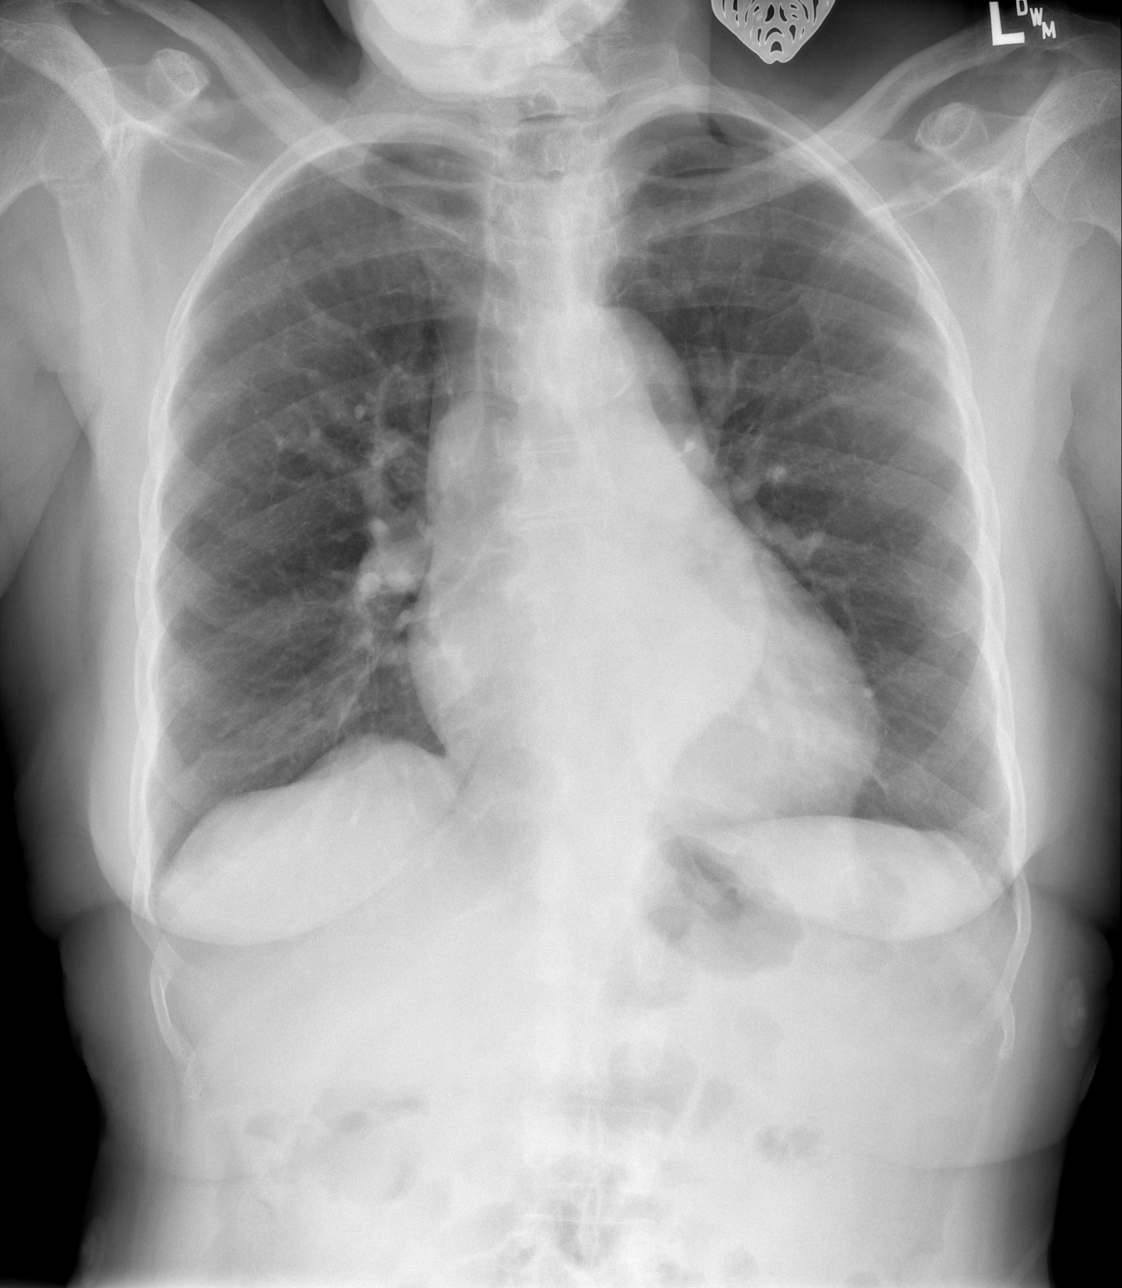

[w abdomen upright]
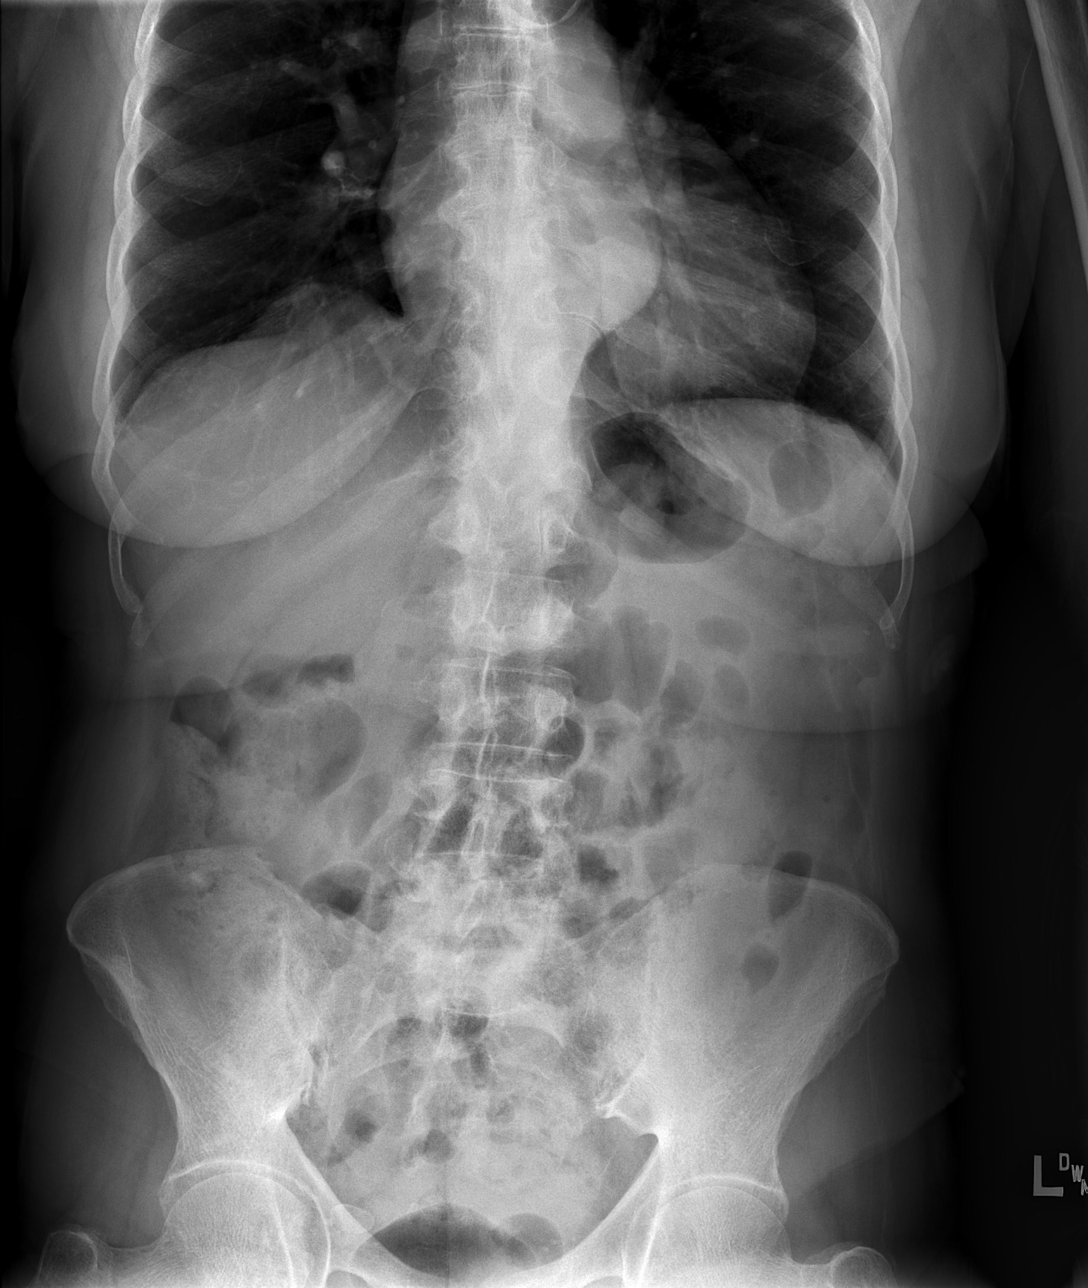

[t abdomen supine]
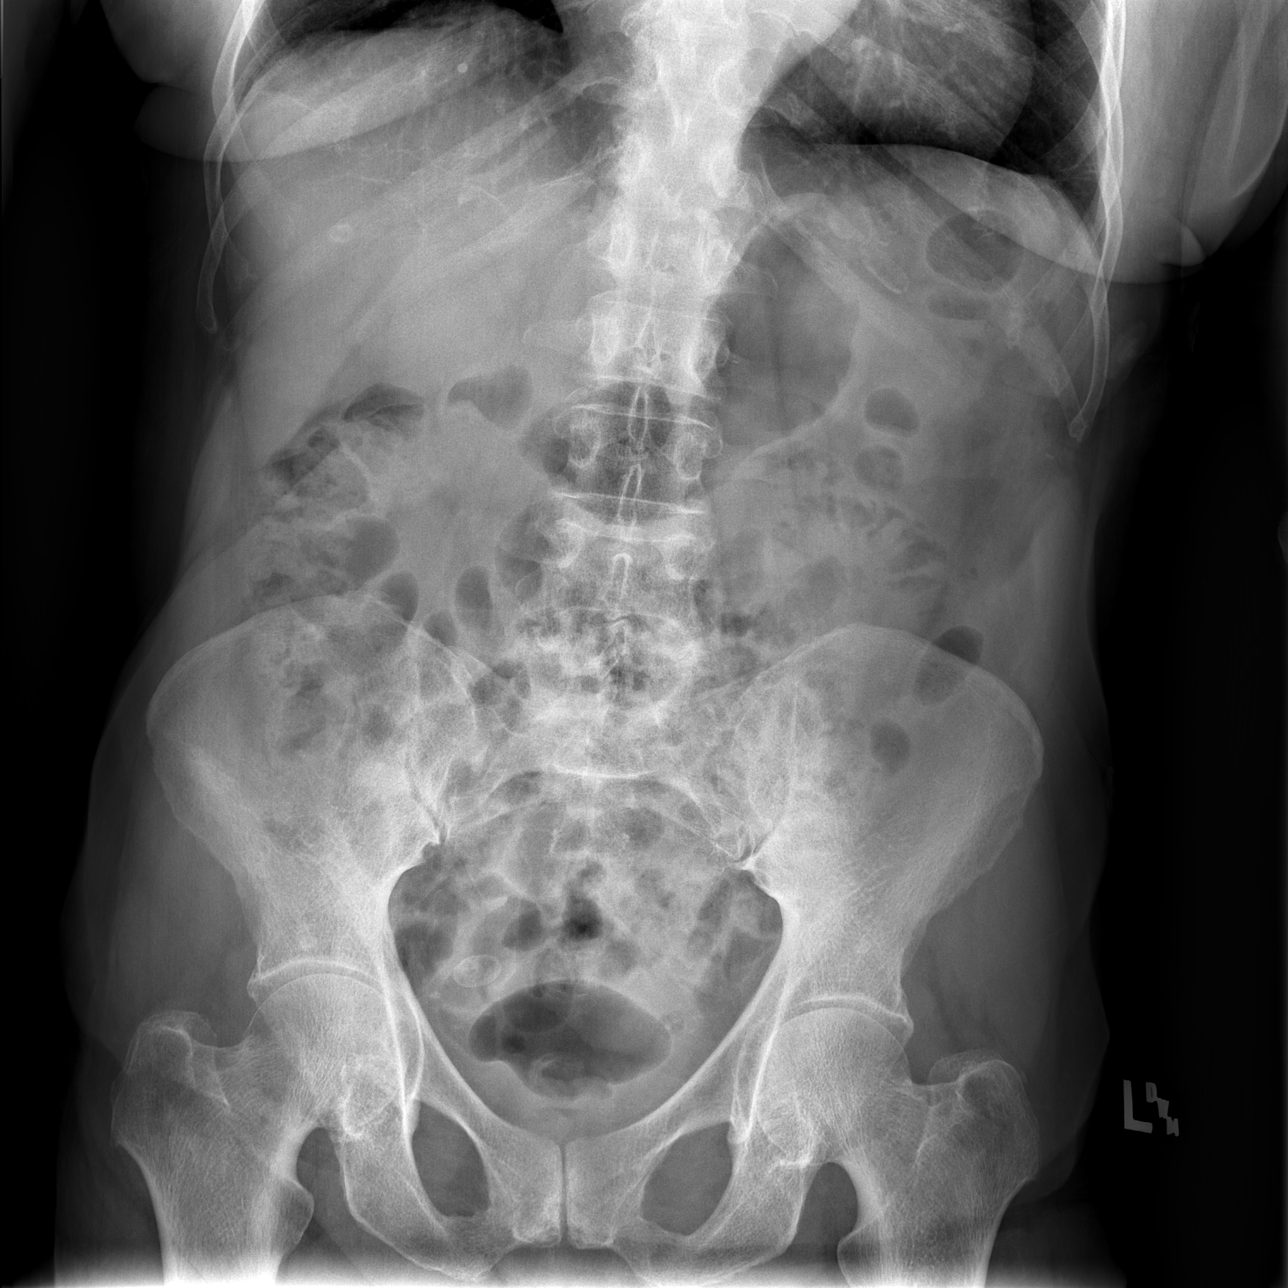

[3 of 3 positions shown; findings below may reference images not displayed]

FINDINGS: Cardiomediastinal contours are unchanged with tortuosity of thoracic
aorta. Heart at the upper limits normal in size. Similar
hyperinflation from prior. No evidence of focal airspace disease or
pulmonary edema.

No free intra-abdominal air. No dilated bowel loops to suggest
obstruction. Small stool burden primarily in the right colon.
Probable phlebolith in the right pelvis.

No acute osseous abnormalities are seen.
IMPRESSION: 1. Normal bowel gas pattern.  No obstruction or free air.
2. No acute pulmonary process. Stable chronic hyperinflation and
tortuous thoracic aorta.

## 2017-09-28 ENCOUNTER — Encounter (HOSPITAL_BASED_OUTPATIENT_CLINIC_OR_DEPARTMENT_OTHER): Payer: Self-pay | Admitting: Emergency Medicine

## 2017-09-28 ENCOUNTER — Emergency Department (HOSPITAL_BASED_OUTPATIENT_CLINIC_OR_DEPARTMENT_OTHER)
Admission: EM | Admit: 2017-09-28 | Discharge: 2017-09-28 | Disposition: A | Discharge status: Home / Self Care | Payer: Medicare HMO | Attending: Emergency Medicine | Admitting: Emergency Medicine

## 2017-09-28 ENCOUNTER — Emergency Department (HOSPITAL_BASED_OUTPATIENT_CLINIC_OR_DEPARTMENT_OTHER): Payer: Medicare HMO

## 2017-09-28 ENCOUNTER — Other Ambulatory Visit: Payer: Self-pay

## 2017-09-28 DIAGNOSIS — Z79899 Other long term (current) drug therapy: Secondary | ICD-10-CM | POA: Insufficient documentation

## 2017-09-28 DIAGNOSIS — Z8551 Personal history of malignant neoplasm of bladder: Secondary | ICD-10-CM | POA: Diagnosis not present

## 2017-09-28 DIAGNOSIS — Z87891 Personal history of nicotine dependence: Secondary | ICD-10-CM | POA: Diagnosis not present

## 2017-09-28 DIAGNOSIS — I2510 Atherosclerotic heart disease of native coronary artery without angina pectoris: Secondary | ICD-10-CM | POA: Insufficient documentation

## 2017-09-28 DIAGNOSIS — N3001 Acute cystitis with hematuria: Secondary | ICD-10-CM | POA: Insufficient documentation

## 2017-09-28 DIAGNOSIS — I10 Essential (primary) hypertension: Secondary | ICD-10-CM | POA: Diagnosis not present

## 2017-09-28 DIAGNOSIS — R300 Dysuria: Secondary | ICD-10-CM | POA: Diagnosis present

## 2017-09-28 LAB — CBC WITH DIFFERENTIAL/PLATELET
BASOS ABS: 0 10*3/uL (ref 0.0–0.1)
Basophils Relative: 0 %
Eosinophils Absolute: 0.2 10*3/uL (ref 0.0–0.7)
Eosinophils Relative: 3 %
HEMATOCRIT: 27 % — AB (ref 36.0–46.0)
Hemoglobin: 8.7 g/dL — ABNORMAL LOW (ref 12.0–15.0)
LYMPHS PCT: 35 %
Lymphs Abs: 2.8 10*3/uL (ref 0.7–4.0)
MCH: 29.3 pg (ref 26.0–34.0)
MCHC: 32.2 g/dL (ref 30.0–36.0)
MCV: 90.9 fL (ref 78.0–100.0)
MONO ABS: 0.6 10*3/uL (ref 0.1–1.0)
Monocytes Relative: 7 %
NEUTROS ABS: 4.5 10*3/uL (ref 1.7–7.7)
Neutrophils Relative %: 55 %
Platelets: 296 10*3/uL (ref 150–400)
RBC: 2.97 MIL/uL — AB (ref 3.87–5.11)
RDW: 13.2 % (ref 11.5–15.5)
WBC: 8.1 10*3/uL (ref 4.0–10.5)

## 2017-09-28 LAB — URINALYSIS, ROUTINE W REFLEX MICROSCOPIC
BILIRUBIN URINE: NEGATIVE
Glucose, UA: 100 mg/dL — AB
KETONES UR: NEGATIVE mg/dL
NITRITE: NEGATIVE
Protein, ur: 30 mg/dL — AB
Specific Gravity, Urine: <1.005 — ABNORMAL LOW (ref 1.005–1.030)
pH: 6.5 (ref 5.0–8.0)

## 2017-09-28 LAB — COMPREHENSIVE METABOLIC PANEL
ALT: 11 U/L — ABNORMAL LOW (ref 14–54)
AST: 22 U/L (ref 15–41)
Albumin: 3.6 g/dL (ref 3.5–5.0)
Alkaline Phosphatase: 53 U/L (ref 38–126)
Anion gap: 10 (ref 5–15)
BILIRUBIN TOTAL: 0.5 mg/dL (ref 0.3–1.2)
BUN: 25 mg/dL — AB (ref 6–20)
CALCIUM: 8.8 mg/dL — AB (ref 8.9–10.3)
CO2: 24 mmol/L (ref 22–32)
Chloride: 106 mmol/L (ref 101–111)
Creatinine, Ser: 2.79 mg/dL — ABNORMAL HIGH (ref 0.44–1.00)
GFR, EST AFRICAN AMERICAN: 17 mL/min — AB (ref >60)
GFR, EST NON AFRICAN AMERICAN: 14 mL/min — AB (ref >60)
Glucose, Bld: 107 mg/dL — ABNORMAL HIGH (ref 65–99)
Potassium: 3.6 mmol/L (ref 3.5–5.1)
Sodium: 140 mmol/L (ref 135–145)
Total Protein: 7.8 g/dL (ref 6.5–8.1)

## 2017-09-28 LAB — URINALYSIS, MICROSCOPIC (REFLEX)

## 2017-09-28 MED ORDER — CEPHALEXIN 500 MG PO CAPS: 500.0000 mg | ORAL_CAPSULE | Freq: Three times a day (TID) | ORAL | 0 refills | Status: AC

## 2017-09-28 MED ORDER — SODIUM CHLORIDE 0.9 % IV SOLN
1.0000 g | Freq: Once | INTRAVENOUS | Status: AC
  Administered 2017-09-28: 1 g via INTRAVENOUS
  Filled 2017-09-28: qty 10

## 2017-09-28 MED ORDER — SODIUM CHLORIDE 0.9 % IV BOLUS
1000.0000 mL | Freq: Once | INTRAVENOUS | Status: AC
  Administered 2017-09-28: 1000 mL via INTRAVENOUS

## 2017-09-28 MED ORDER — MORPHINE SULFATE (PF) 4 MG/ML IV SOLN
4.0000 mg | Freq: Once | INTRAVENOUS | Status: AC
  Administered 2017-09-28: 4 mg via INTRAVENOUS
  Filled 2017-09-28: qty 1

## 2017-09-28 NOTE — ED Notes (Signed)
Family given d/c instructions as per chart. Rx x 1. Verbalizes understanding. No questions.

## 2017-09-28 NOTE — Discharge Instructions (Addendum)
It was so nice to meet you!  Your burning with urination is likely caused by a urinary tract infection. We did an ultrasound of your kidneys, which showed dilation (widening) of your ureters and kidneys, which seems to have been going on for a while. We also did a CT scan of your kidneys and it did not show a kidney stone.  Please take the Keflex three times a day for 7 days.

## 2017-09-28 NOTE — ED Provider Notes (Signed)
[facility] EMERGENCY DEPARTMENT Provider Note   CSN: [id number] Arrival date & time: 09/28/17  1715   History   Chief Complaint Chief Complaint  Patient presents with  . Dysuria  . Flank Pain    HPI Krista Joseph is a 84 y.o. female with a PMH of bladder cancer and recurrent UTIs, CAD, HLD, and HTN presenting to the ED with dysuria and urinary frequency for the last week. Per patient, she was treated by her PCP for UTI three weeks ago with a 5 day course of antibiotics. Her symptoms improved but never completely resolved. Her symptoms then got worse 1 week ago. She also notes right sided low back pain for the last week. She denies fevers but endorses chills. No nausea, no vomiting. She endorses decreased appetite.  Past Medical History:  Diagnosis Date  . Acid reflux   . Arthritis   . Bronchitis   . Coronary artery disease   . Hypercholesteremia   . Hyperlipidemia   . Hypertension     There are no active problems to display for this patient.   Past Surgical History:  Procedure Laterality Date  . ABDOMINAL HYSTERECTOMY    . VAGINAL HYSTERECTOMY       OB History    Gravida  4   Para  4   Term  4   Preterm      AB      Living        SAB      TAB      Ectopic      Multiple      Live Births               Home Medications    Prior to Admission medications   Medication Sig Start Date End Date Taking? Authorizing Provider  amLODipine (NORVASC) 5 MG tablet Take 5 mg by mouth daily.    [provider]  amoxicillin-clavulanate (AUGMENTIN) 875-125 MG tablet Take 1 tablet by mouth 2 (two) times daily. One po bid x 7 days 05/01/17   Charlesetta Shanks, MD  apixaban (ELIQUIS) 5 MG TABS tablet Take 5 mg by mouth 2 (two) times daily.    [provider]  aspirin 81 MG tablet Take 81 mg by mouth daily.    [provider]  atorvastatin (LIPITOR) 20 MG tablet Take 20 mg by mouth daily.    [provider]  furosemide  (LASIX) 20 MG tablet Take 20 mg by mouth 2 (two) times daily.    [provider]  oxyCODONE-acetaminophen (PERCOCET/ROXICET) 5-325 MG tablet Take by mouth every 4 (four) hours as needed for severe pain.    [provider]  phenazopyridine (PYRIDIUM) 200 MG tablet Take 1 tablet (200 mg total) by mouth 3 (three) times daily as needed for pain. 05/01/17   Charlesetta Shanks, MD  Pitavastatin Calcium (LIVALO) 2 MG TABS Take by mouth.    [provider]    Family History History reviewed. No pertinent family history.  Social History Social History   Tobacco Use  . Smoking status: Former Smoker    Packs/day: 0.25    Years: 1.00    Pack years: 0.25    Types: Cigarettes    Last attempt to quit: 09/27/1961    Years since quitting: 56.0  . Smokeless tobacco: Never Used  Substance Use Topics  . Alcohol use: No  . Drug use: No     Allergies   Ivp dye [iodinated diagnostic agents]  Review of Systems Review of Systems  Constitutional: Positive for appetite change and chills. Negative for fever.  Respiratory: Negative for shortness of breath.   Cardiovascular: Positive for leg swelling. Negative for chest pain.  Gastrointestinal: Positive for constipation. Negative for abdominal pain, nausea and vomiting.  Genitourinary: Positive for dysuria, flank pain, frequency and urgency. Negative for hematuria.  Musculoskeletal: Positive for back pain. Negative for neck pain and neck stiffness.  Neurological: Negative for dizziness and headaches.     Physical Exam Updated Vital Signs BP (!) 149/63 (BP Location: Left Arm)   Pulse 72   Temp 99.8 F (37.7 C) (Oral)   Resp 18   Wt 59 kg (130 lb)   SpO2 100%   BMI 22.31 kg/m   Physical Exam  Constitutional: She is oriented to person, place, and time. No distress.  Thin, elderly female  HENT:  Head: Normocephalic and atraumatic.  Eyes: Conjunctivae and EOM are normal.  Neck: Normal range of motion. Neck supple.    Cardiovascular: Normal rate and regular rhythm.  No murmur heard. Pulmonary/Chest: Effort normal and breath sounds normal. No respiratory distress. She has no wheezes.  Abdominal: Soft. Bowel sounds are normal. She exhibits no distension. There is no tenderness. There is no rebound and no guarding.  Musculoskeletal: Normal range of motion.  +CVA tenderness on the right 1+ edema in the feet/ankles bilaterally  Neurological: She is alert and oriented to person, place, and time. No cranial nerve deficit.  Skin: Skin is warm and dry. Capillary refill takes less than 2 seconds. No rash noted.  Psychiatric: She has a normal mood and affect. Her behavior is normal. Judgment and thought content normal.     ED Treatments / Results  Labs (all labs ordered are listed, but only abnormal results are displayed) Labs Reviewed  URINALYSIS, ROUTINE W REFLEX MICROSCOPIC - Abnormal; Notable for the following components:      Result Value   Specific Gravity, Urine <1.005 (*)    Glucose, UA 100 (*)    Hgb urine dipstick SMALL (*)    Protein, ur 30 (*)    Leukocytes, UA TRACE (*)    All other components within normal limits  URINALYSIS, MICROSCOPIC (REFLEX) - Abnormal; Notable for the following components:   Bacteria, UA FEW (*)    All other components within normal limits  URINE CULTURE    EKG None  Radiology No results found.  Procedures Procedures (including critical care time)  Medications Ordered in ED Medications - No data to display   Initial Impression / Assessment and Plan / ED Course  I have reviewed the triage vital signs and the nursing notes.  Pertinent labs & imaging results that were available during my care of the patient were reviewed by me and considered in my medical decision making (see chart for details).  84 year old female with a history of bladder cancer and recurrent UTIs presenting with dysuria, urinary frequency, and right sided low back pain. UA with trace  leukocytes and few bacteria, but patient recently treated with a 5 day antibiotic course. Urine culture ordered. Will also obtain renal US, as patient has a history of bilateral hydronephrosis seen on previous US. Will give a 1L bolus NS and give CTX IV x 1.   8:30PM: Labs with Hgb of 8.7 (but close to baseline when compared with recent labs in [place]). Cr is 2.79 (increased from a baseline of ~2.15), so will obtain renal US to assess for worsening hydronephrosis  and CT renal stone study to rule out stone as a cause of recurrent UTIs.  10:00PM: Renal US with moderate to severe bilateral hydronephrosis R > L, findings are similar to past retrograde urography from 06/05/17. CT renal stone study with mild to moderate bilateral hydronephrosis likely secondary to an infiltrative mass in the posterior bladder wall, also with some perivesical stranding of the bladder wall concerning for superimposed infection. Patient re-assessed and is feeling better. Will discharge patient home with Keflex 500mg  tid x 7 days. She has follow-up with her urologist on 10/02/17.  Final Clinical Impressions(s) / ED Diagnoses   Final diagnoses:  None    ED Discharge Orders    None       Christan Ciccarelli, Pete Pelt, MD 09/28/17 2312    Drenda Freeze, MD 09/28/17 2330

## 2017-09-28 NOTE — ED Notes (Signed)
A purewick was placed for patient comfort.

## 2017-09-28 NOTE — ED Triage Notes (Signed)
Patient states that she is having pain to her bilateral flank region and frequency with urination x 2 weeks. She reports that she has a recent UTI that she has not gotten over. THe patient also reports that she is having some swelling in her legs

## 2017-09-30 LAB — URINE CULTURE

## 2019-01-31 IMAGING — US US RENAL
1 series · 14 of 25 positions shown · non-contrast
Comparison: Images from [REDACTED] bilateral
retrograde urography 06/05/2017.

CT Abdomen and Pelvis 09/13/2016.

CLINICAL DATA: 87-year-old female with right flank pain and dysuria
for 2-3 days.

EXAM:
RENAL / URINARY TRACT ULTRASOUND COMPLETE

[Series 1: us renal · 0.24mm/px · 14 of 33 slices shown]
[im 1/33]
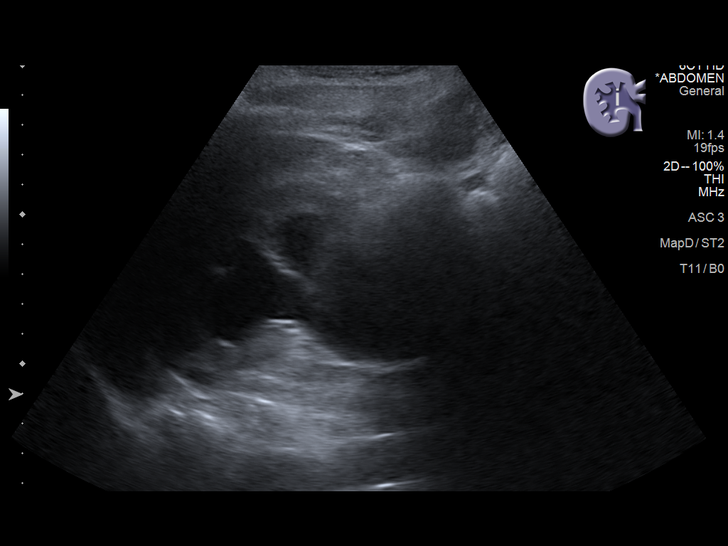
[im 3/33]
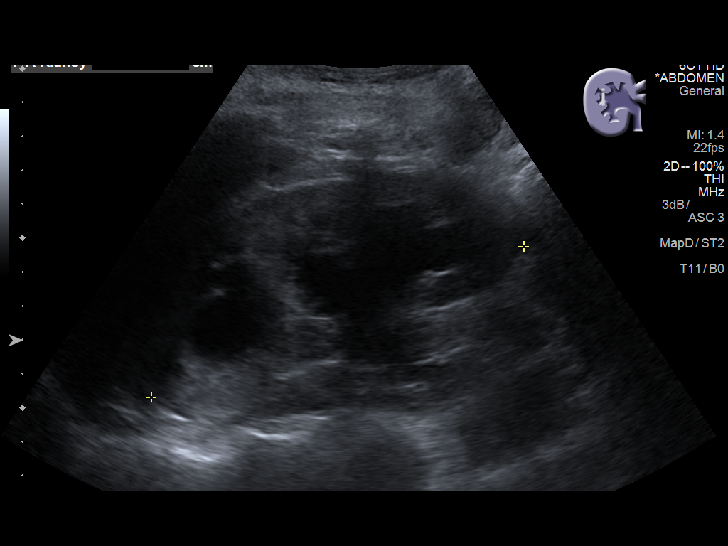
[im 6/33]
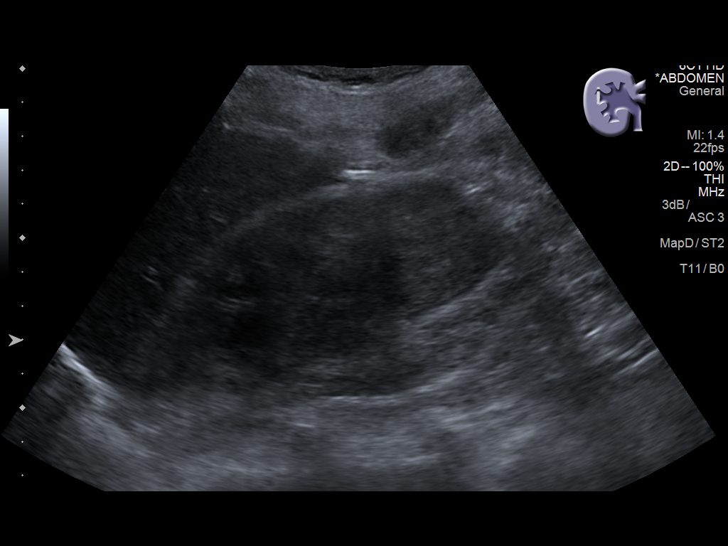
[im 9/33]
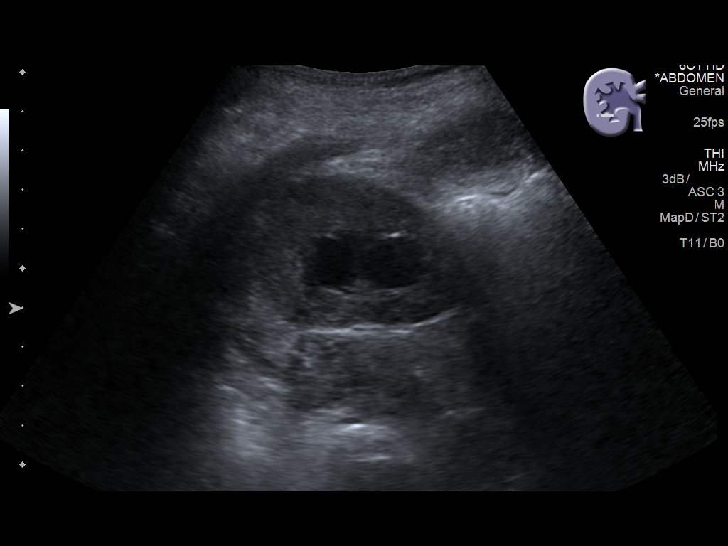
[im 11/33]
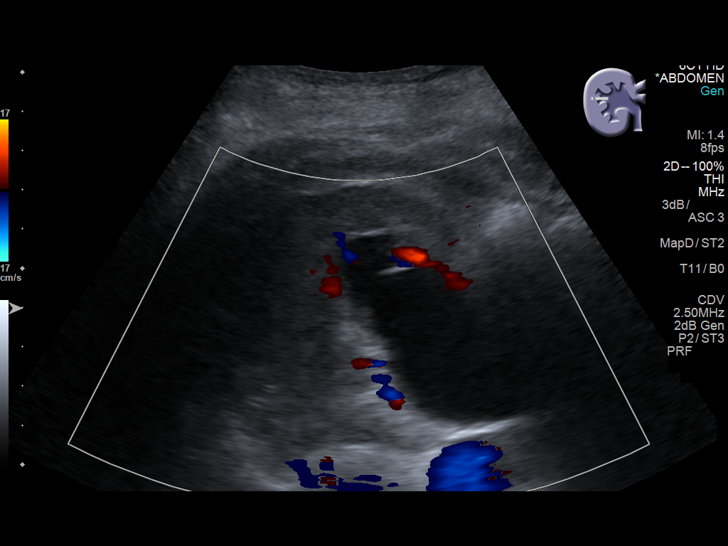
[im 13/33]
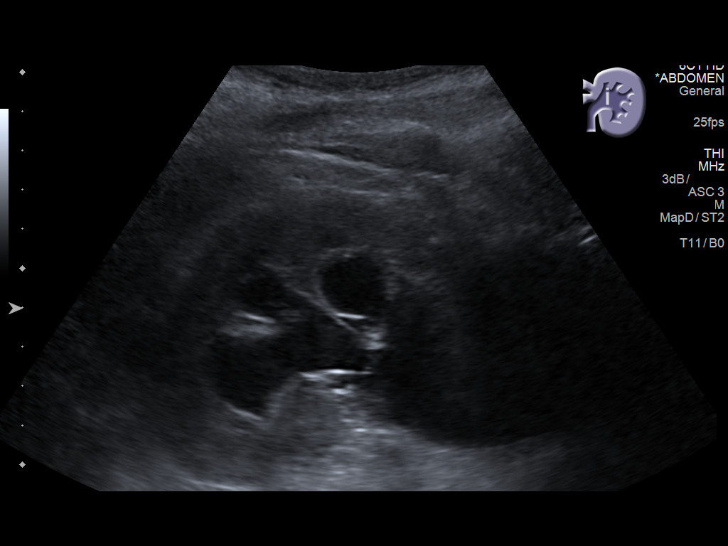
[im 15/33]
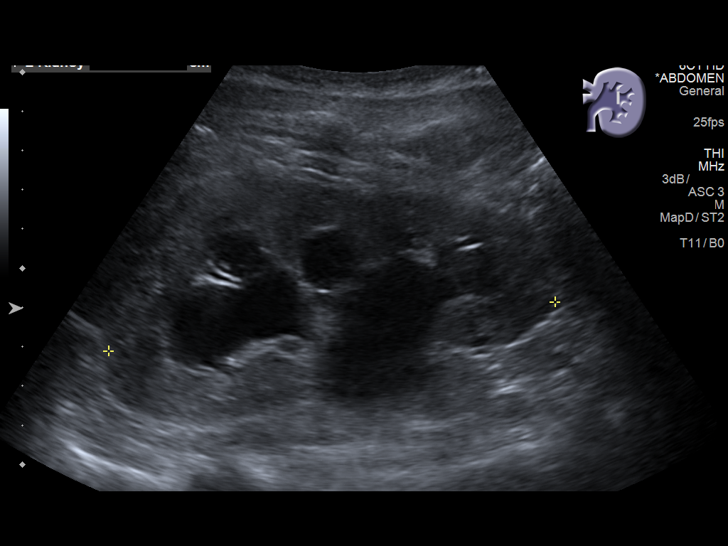
[im 18/33]
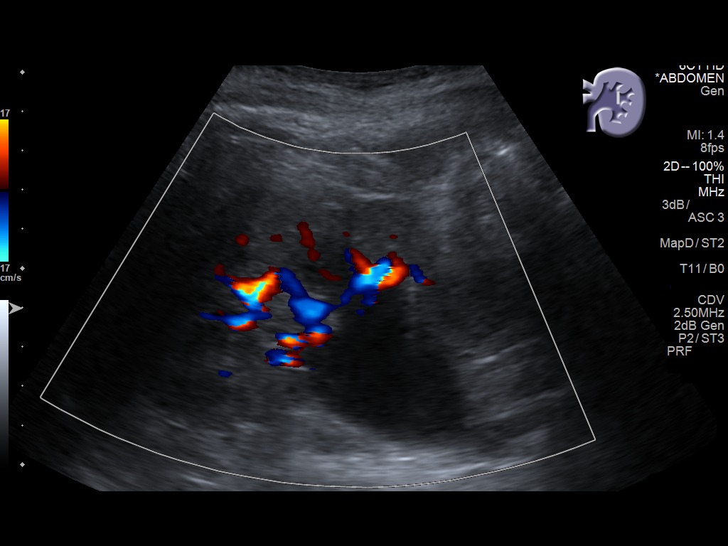
[im 21/33]
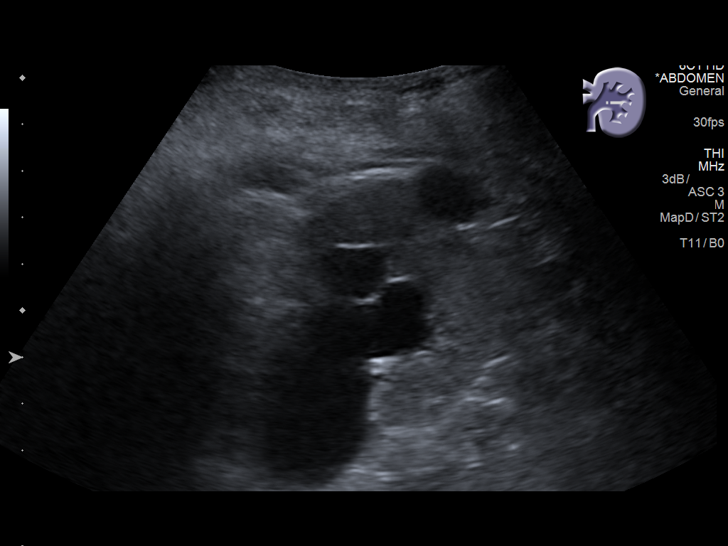
[im 22/33]
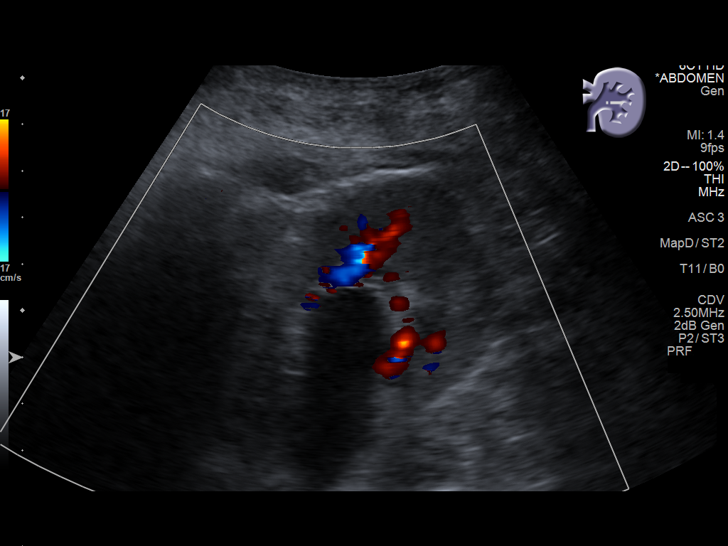
[im 25/33]
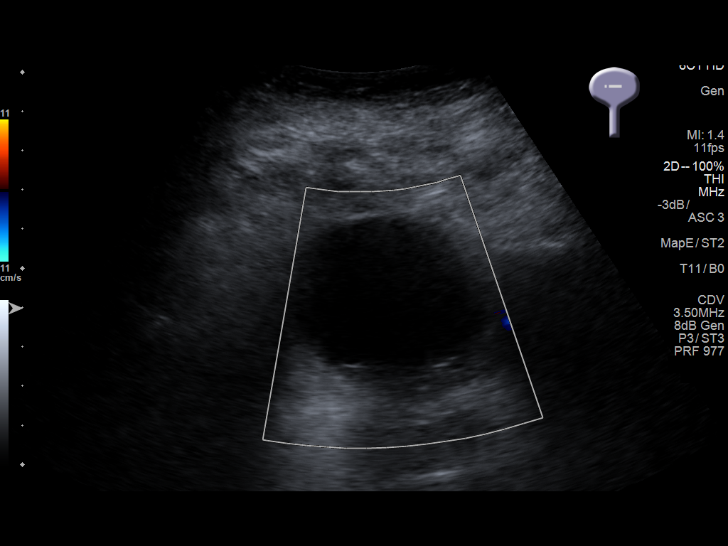
[im 27/33]
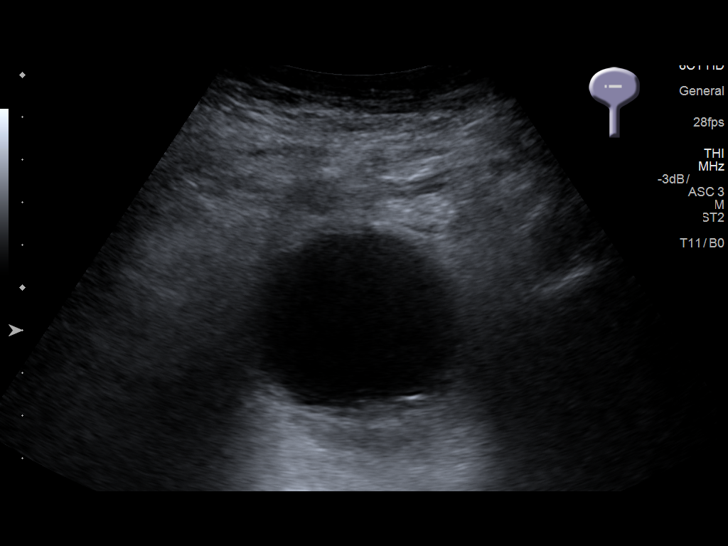
[im 30/33]
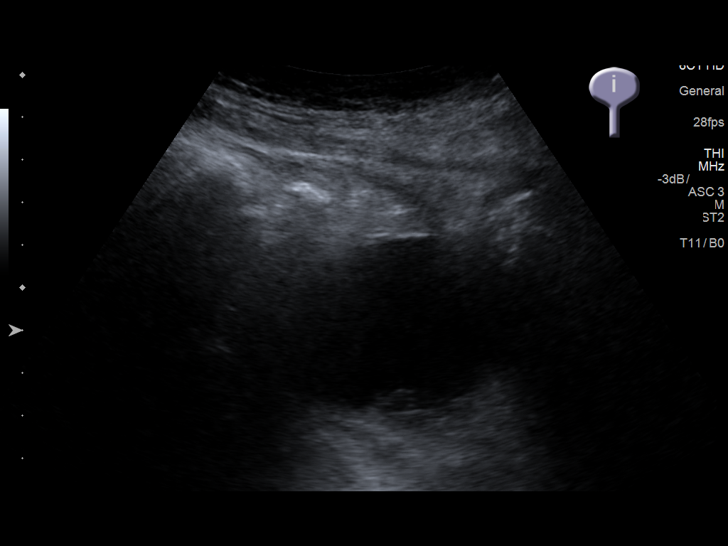
[im 33/33]
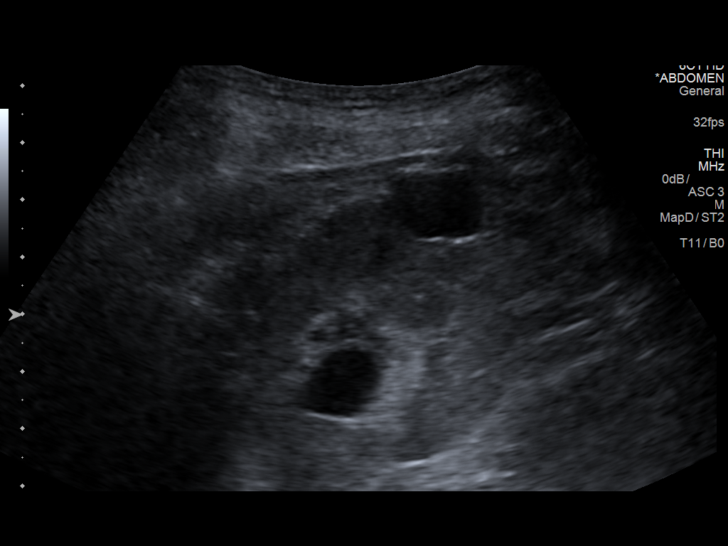

[14 of 25 positions shown; findings below may reference images not displayed]

FINDINGS: Right Kidney:

Length: 11.8 centimeters. Moderate to severe right hydronephrosis
appears similar to that on 06/05/2017, and is new since the 4839 CT.
See series 7. Right renal cortical echogenicity also appears
increased relative to the liver.

Left Kidney:

Length: 11.4 centimeters. Moderate left hydronephrosis appears
similar to 06/05/2017 and is new since the 4839 CT. Left renal
cortical echogenicity may also be increased. There is a small
chronic left renal lower pole simple cyst measuring 15 millimeters.

Bladder:

Diminutive.  Questionable layering debris (image 33).
IMPRESSION: 1. Moderate to severe bilateral hydronephrosis, worse on the right
and similar to that on 06/05/2017 retrograde urography images.
Bilateral ureteral stents in place in [REDACTED] are not evident today.
2. Possible debris within the urinary bladder.
3. Chronic medical renal disease suspected.

## 2019-06-09 IMAGING — CT CT RENAL STONE PROTOCOL
2 of 4 series · 14 of 46 positions shown, 16 images · non-contrast
Comparison: Renal ultrasound dated 09/28/2017 and CT of the abdomen
pelvis dated 09/13/2016

ADDENDUM:
Please note the patient is female. There is history of hysterectomy.
The described prostate gland on the report corresponds to the region
of the cervix and vaginal cuff.

Regardless there is thickening and infiltration of the posterior
bladder wall with narrowing of the UVJ's bilaterally causing
bilateral hydronephroureter. Correlation with cystoscopy
recommended.
CLINICAL DATA: 87-year-old female with bilateral flank pain.
Urinary frequency.
EXAM:
CT ABDOMEN AND PELVIS WITHOUT CONTRAST
TECHNIQUE: Multidetector CT imaging of the abdomen and pelvis was performed
following the standard protocol without IV contrast.

[Series 2: axial st · axial · 0.64mm/px · z∈[+393,+718]mm · 11 of 79 slices shown, 13 images]
[im 7/79  soft-tissue]
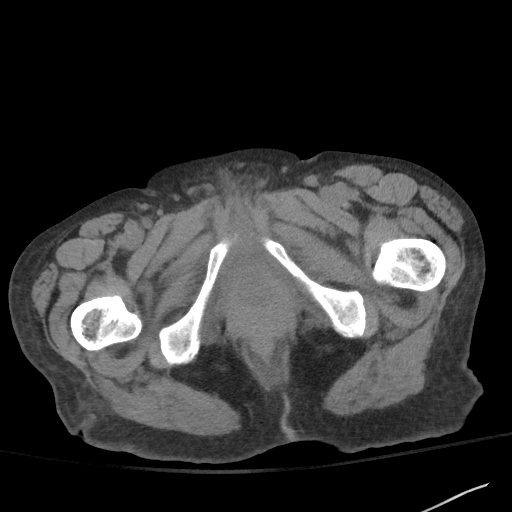
[im 7/79  bone]
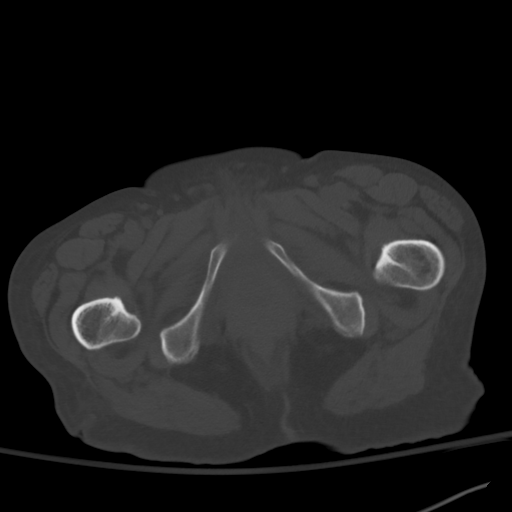
[im 14/79  soft-tissue]
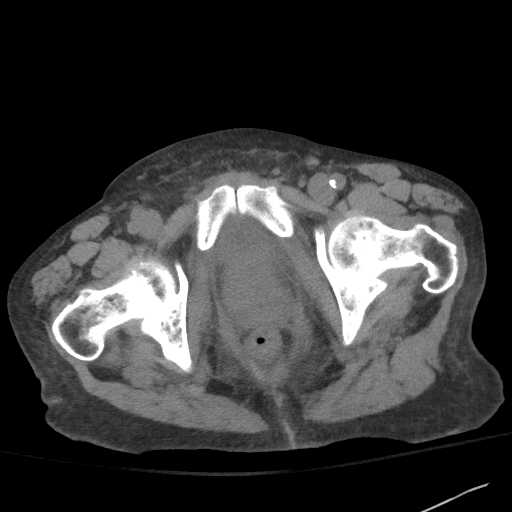
[im 20/79  soft-tissue]
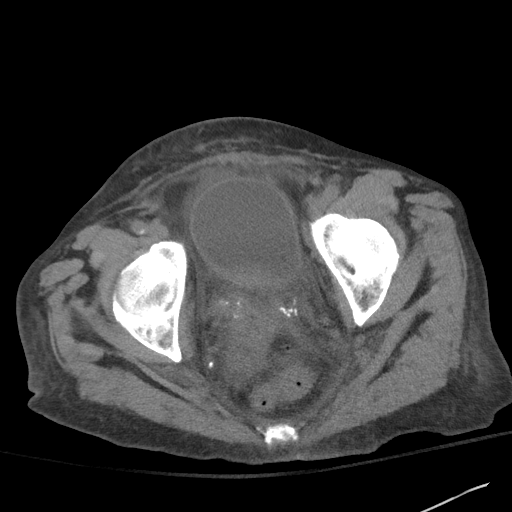
[im 27/79  soft-tissue]
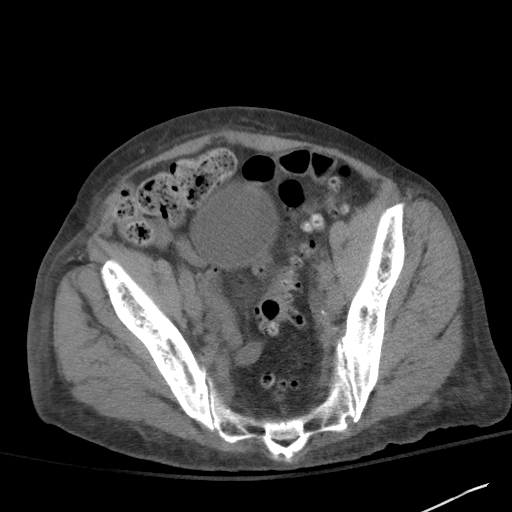
[im 33/79  soft-tissue]
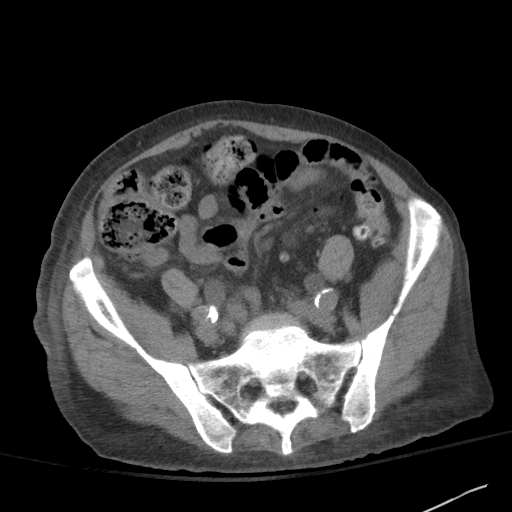
[im 40/79  soft-tissue]
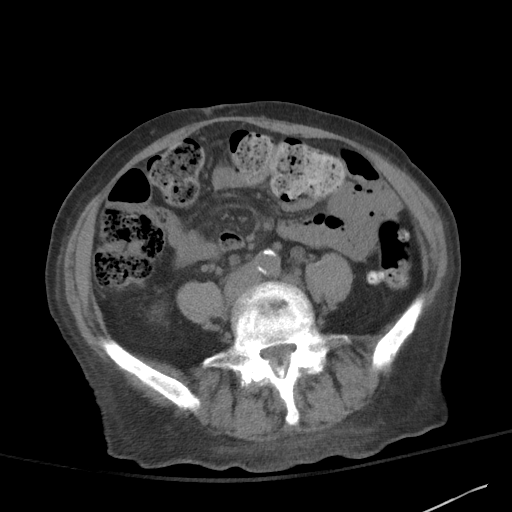
[im 46/79  soft-tissue]
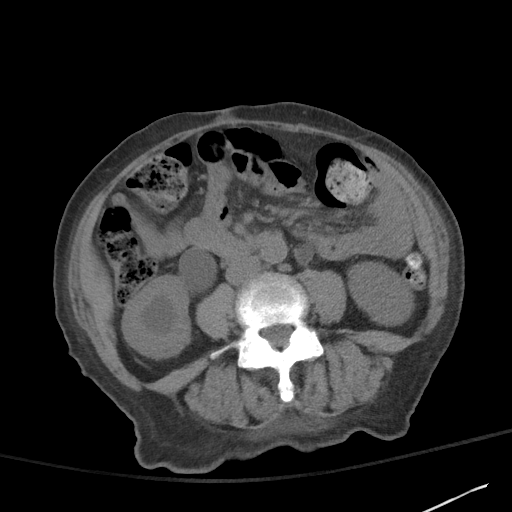
[im 53/79  soft-tissue]
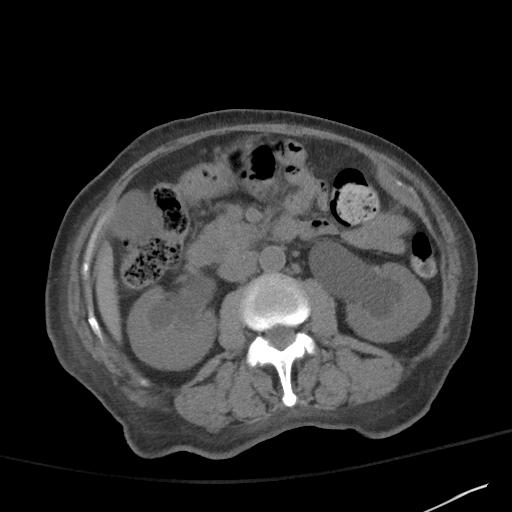
[im 59/79  soft-tissue]
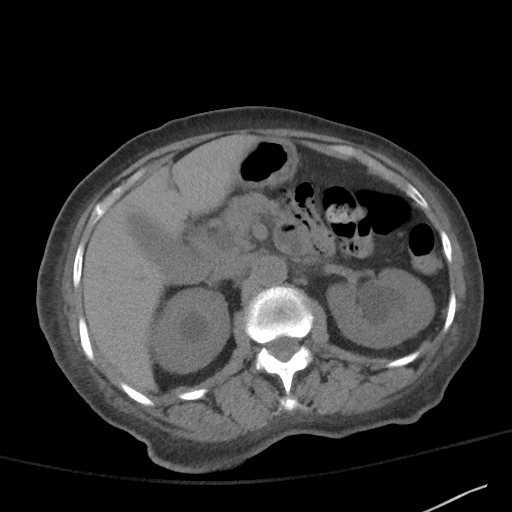
[im 59/79  bone]
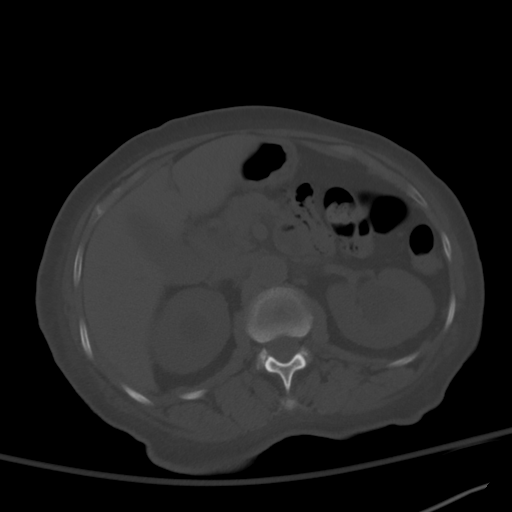
[im 66/79  soft-tissue]
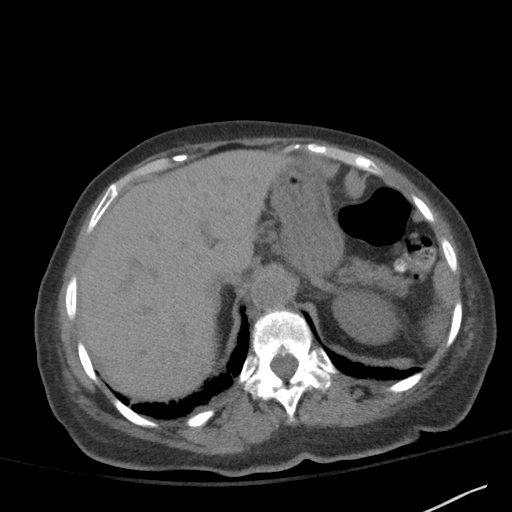
[im 72/79  soft-tissue]
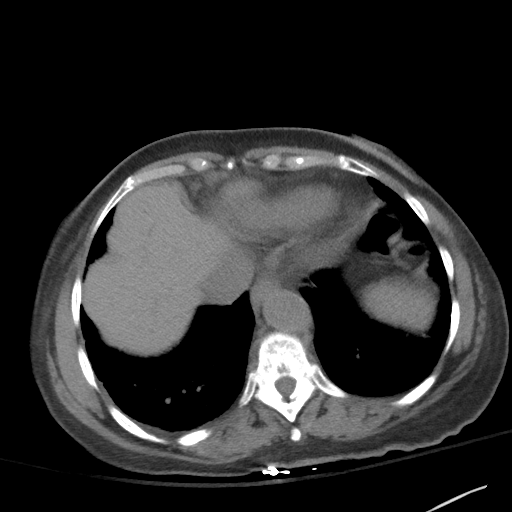

[Series 5: coronal st · coronal · 0.58mm/px · 3 of 82 slices shown]
[im 28/82  soft-tissue]
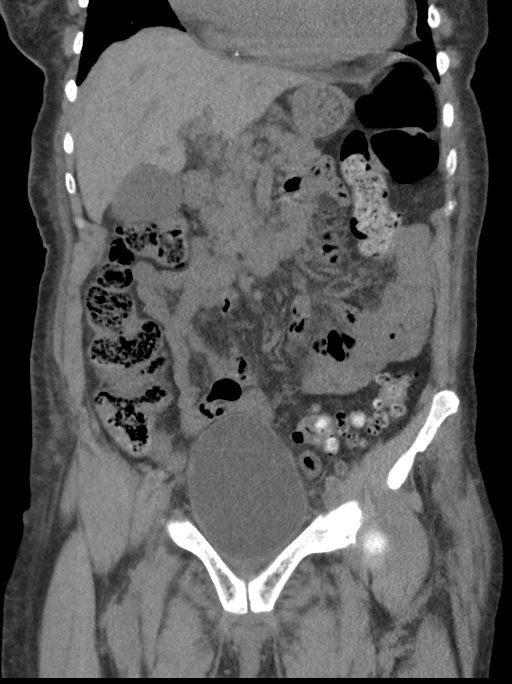
[im 37/82  soft-tissue]
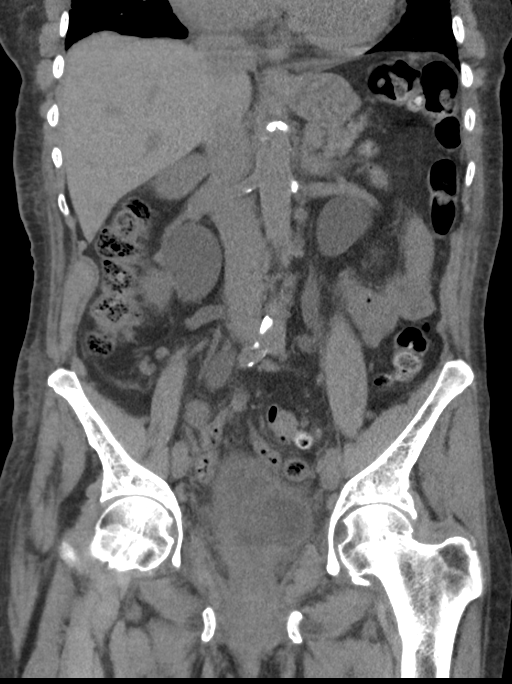
[im 46/82  soft-tissue]
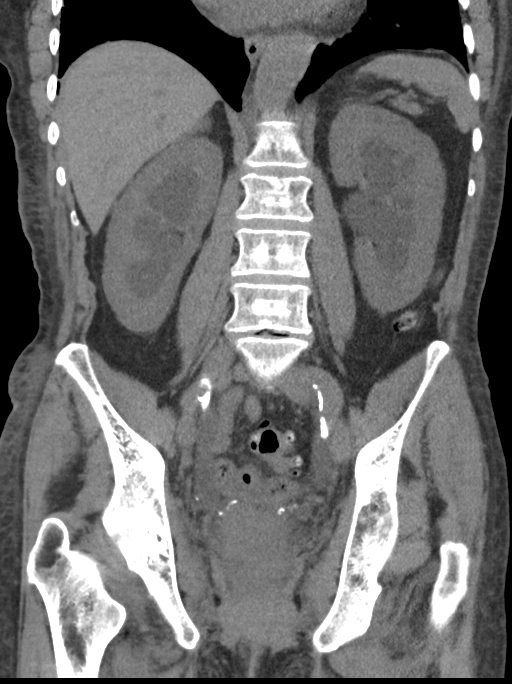

[14 of 46 positions shown; findings below may reference images not displayed]

FINDINGS: Evaluation of this exam is limited in the absence of intravenous
contrast.

Lower chest: There is mild cardiomegaly. There is hypoattenuation of
the cardiac blood pool suggestive of a degree of anemia. Clinical
correlation is recommended. Minimal bibasilar dependent atelectatic
changes noted.

No intra-abdominal free air or free fluid.

Hepatobiliary: The liver is unremarkable. No intrahepatic biliary
ductal dilatation. Gallbladder is unremarkable as well.

Pancreas: Unremarkable. No pancreatic ductal dilatation or
surrounding inflammatory changes.

Spleen: Normal in size without focal abnormality.

Adrenals/Urinary Tract: The adrenal glands are unremarkable. There
is mild-to-moderate bilateral hydronephrosis with mild dilatation of
the ureters bilaterally. No obstructing stone identified. There is a
1.5 cm exophytic hypodense lesion arising from the posterior cortex
of the left kidney which is not well characterized on this
noncontrast CT. There is haziness of the bladder wall. There is
thickening of the posterior and base of the bladder wall with loss
of fat plane between the prostate and urinary bladder concerning for
infiltrative neoplasm rising from the prostate and extending into
the urinary bladder. There is narrowing of the ureterovesical
junctions bilaterally. A TURP defect may be present. There is
diffuse perivesical and pelvic stranding concerning for cystitis.
Correlation with urinalysis recommended. Additional areas of nodular
thickening of the bladder wall noted (coronal series 5, image 31).

Stomach/Bowel: There is extensive sigmoid diverticulosis with
muscular hypertrophy. No active inflammatory changes. Moderate
amount of stool noted throughout the colon. There is no bowel
obstruction or active inflammation. The appendix is normal.

Vascular/Lymphatic: There is moderate aortoiliac atherosclerotic
disease. No portal venous gas. There is no adenopathy.

Reproductive: Top-normal prostate gland measuring 4 cm in diameter.
The seminal vesicles are grossly unremarkable as visualized.

Other: There is mild diffuse subcutaneous edema. No fluid
collection.

Musculoskeletal: Grade 1 L4-L5 anterolisthesis. There is
degenerative changes of the hips. No acute osseous pathology.
IMPRESSION: 1. Mild to moderate bilateral hydronephrosis secondary to
obstruction/narrowing of the ureterovesical junctions, likely
secondary to infiltrative mass in the posterior bladder wall.
Correlation with clinical exam and cystoscopy recommended. There is
thickened and hazy appearance of the bladder wall with perivesical
stranding concerning for superimposed infection. Correlation with
urinalysis recommended.
2. Sigmoid diverticulosis. No bowel obstruction or active
inflammation. Normal appendix.
3.  Aortic Atherosclerosis ([id number]).

## 2020-05-27 ENCOUNTER — Other Ambulatory Visit (HOSPITAL_BASED_OUTPATIENT_CLINIC_OR_DEPARTMENT_OTHER): Payer: Self-pay | Admitting: Emergency Medicine

## 2020-05-27 ENCOUNTER — Emergency Department (HOSPITAL_BASED_OUTPATIENT_CLINIC_OR_DEPARTMENT_OTHER)
Admission: EM | Admit: 2020-05-27 | Discharge: 2020-05-27 | Disposition: A | Discharge status: Home / Self Care | Payer: Medicare HMO | Attending: Emergency Medicine | Admitting: Emergency Medicine

## 2020-05-27 ENCOUNTER — Other Ambulatory Visit: Payer: Self-pay

## 2020-05-27 ENCOUNTER — Encounter (HOSPITAL_BASED_OUTPATIENT_CLINIC_OR_DEPARTMENT_OTHER): Payer: Self-pay

## 2020-05-27 ENCOUNTER — Emergency Department (HOSPITAL_BASED_OUTPATIENT_CLINIC_OR_DEPARTMENT_OTHER): Payer: Medicare HMO

## 2020-05-27 DIAGNOSIS — Z87891 Personal history of nicotine dependence: Secondary | ICD-10-CM | POA: Insufficient documentation

## 2020-05-27 DIAGNOSIS — Z20822 Contact with and (suspected) exposure to covid-19: Secondary | ICD-10-CM | POA: Diagnosis not present

## 2020-05-27 DIAGNOSIS — I2510 Atherosclerotic heart disease of native coronary artery without angina pectoris: Secondary | ICD-10-CM | POA: Diagnosis not present

## 2020-05-27 DIAGNOSIS — Z7901 Long term (current) use of anticoagulants: Secondary | ICD-10-CM | POA: Diagnosis not present

## 2020-05-27 DIAGNOSIS — R509 Fever, unspecified: Secondary | ICD-10-CM

## 2020-05-27 DIAGNOSIS — I10 Essential (primary) hypertension: Secondary | ICD-10-CM | POA: Insufficient documentation

## 2020-05-27 DIAGNOSIS — Z79899 Other long term (current) drug therapy: Secondary | ICD-10-CM | POA: Diagnosis not present

## 2020-05-27 DIAGNOSIS — N3000 Acute cystitis without hematuria: Secondary | ICD-10-CM

## 2020-05-27 DIAGNOSIS — Z7982 Long term (current) use of aspirin: Secondary | ICD-10-CM | POA: Diagnosis not present

## 2020-05-27 LAB — URINALYSIS, ROUTINE W REFLEX MICROSCOPIC
Bilirubin Urine: NEGATIVE
Glucose, UA: NEGATIVE mg/dL
Ketones, ur: NEGATIVE mg/dL
Nitrite: NEGATIVE
Protein, ur: 100 mg/dL — AB
Specific Gravity, Urine: 1.015 (ref 1.005–1.030)
pH: 6 (ref 5.0–8.0)

## 2020-05-27 LAB — COMPREHENSIVE METABOLIC PANEL
ALT: 26 U/L (ref 0–44)
AST: 77 U/L — ABNORMAL HIGH (ref 15–41)
Albumin: 3.3 g/dL — ABNORMAL LOW (ref 3.5–5.0)
Alkaline Phosphatase: 84 U/L (ref 38–126)
Anion gap: 11 (ref 5–15)
BUN: 33 mg/dL — ABNORMAL HIGH (ref 8–23)
CO2: 23 mmol/L (ref 22–32)
Calcium: 8.5 mg/dL — ABNORMAL LOW (ref 8.9–10.3)
Chloride: 106 mmol/L (ref 98–111)
Creatinine, Ser: 1.54 mg/dL — ABNORMAL HIGH (ref 0.44–1.00)
GFR, Estimated: 32 mL/min — ABNORMAL LOW (ref >60)
Glucose, Bld: 102 mg/dL — ABNORMAL HIGH (ref 70–99)
Potassium: 3.5 mmol/L (ref 3.5–5.1)
Sodium: 140 mmol/L (ref 135–145)
Total Bilirubin: 1 mg/dL (ref 0.3–1.2)
Total Protein: 7 g/dL (ref 6.5–8.1)

## 2020-05-27 LAB — CBC WITH DIFFERENTIAL/PLATELET
Abs Immature Granulocytes: 0.29 10*3/uL — ABNORMAL HIGH (ref 0.00–0.07)
Basophils Absolute: 0 10*3/uL (ref 0.0–0.1)
Basophils Relative: 0 %
Eosinophils Absolute: 0 10*3/uL (ref 0.0–0.5)
Eosinophils Relative: 0 %
HCT: 33.3 % — ABNORMAL LOW (ref 36.0–46.0)
Hemoglobin: 11 g/dL — ABNORMAL LOW (ref 12.0–15.0)
Immature Granulocytes: 2 %
Lymphocytes Relative: 3 %
Lymphs Abs: 0.4 10*3/uL — ABNORMAL LOW (ref 0.7–4.0)
MCH: 31.9 pg (ref 26.0–34.0)
MCHC: 33 g/dL (ref 30.0–36.0)
MCV: 96.5 fL (ref 80.0–100.0)
Monocytes Absolute: 0.8 10*3/uL (ref 0.1–1.0)
Monocytes Relative: 6 %
Neutro Abs: 11.9 10*3/uL — ABNORMAL HIGH (ref 1.7–7.7)
Neutrophils Relative %: 89 %
Platelets: 191 10*3/uL (ref 150–400)
RBC: 3.45 MIL/uL — ABNORMAL LOW (ref 3.87–5.11)
RDW: 13.6 % (ref 11.5–15.5)
WBC: 13.4 10*3/uL — ABNORMAL HIGH (ref 4.0–10.5)
nRBC: 0 % (ref 0.0–0.2)

## 2020-05-27 LAB — URINALYSIS, MICROSCOPIC (REFLEX): WBC, UA: >50 WBC/hpf (ref 0–5)

## 2020-05-27 LAB — APTT: aPTT: 40 seconds — ABNORMAL HIGH (ref 24–36)

## 2020-05-27 LAB — RESP PANEL BY RT-PCR (FLU A&B, COVID) ARPGX2
Influenza A by PCR: NEGATIVE
Influenza B by PCR: NEGATIVE
SARS Coronavirus 2 by RT PCR: NEGATIVE

## 2020-05-27 LAB — PROTIME-INR
INR: 1.6 — ABNORMAL HIGH (ref 0.8–1.2)
Prothrombin Time: 18.6 seconds — ABNORMAL HIGH (ref 11.4–15.2)

## 2020-05-27 LAB — LACTIC ACID, PLASMA: Lactic Acid, Venous: 1.9 mmol/L (ref 0.5–1.9)

## 2020-05-27 MED ORDER — SODIUM CHLORIDE 0.9 % IV SOLN
1.0000 g | Freq: Once | INTRAVENOUS | Status: AC
  Administered 2020-05-27: 1 g via INTRAVENOUS
  Filled 2020-05-27: qty 10

## 2020-05-27 MED ORDER — ACETAMINOPHEN 325 MG PO TABS
650.0000 mg | ORAL_TABLET | Freq: Once | ORAL | Status: AC | PRN
  Administered 2020-05-27: 650 mg via ORAL
  Filled 2020-05-27: qty 2

## 2020-05-27 MED ORDER — CEFPODOXIME PROXETIL 200 MG PO TABS: 200.0000 mg | ORAL_TABLET | Freq: Two times a day (BID) | ORAL | 0 refills | Status: DC

## 2020-05-27 MED FILL — CEFDINIR 300 MG CAPSULE: 300 | 10 days supply | Qty: 20 | Fill #0

## 2020-05-27 NOTE — Discharge Instructions (Signed)
Please read and follow all provided instructions.  Your diagnoses today include:  1. Acute cystitis without hematuria   2. Febrile illness     Tests performed today include:  Urine test - suggests that you have an infection in your bladder Blood cell counts (white, red, and platelets) - high white blood cells Electrolytes  Kidney function test Liver function test Urine test to check for infection - shows sign of infection  Lactic acid - was normal  Blood and urine cultures - pending  Vital signs. See below for your results today.   Medications prescribed:   Cefpodoxime - antibiotic for urinary infection  Home care instructions:  Follow any educational materials contained in this packet.  Follow-up instructions: Please follow-up with your primary care provider in 3 days  for further evaluation of your symptoms.  Return instructions:   Please return to the Emergency Department if you experience worsening symptoms.   Return if you get a phone call stating that your blood cultures are positive.  Return with persistent fever, worsening pain, persistent vomiting, worsening pain in your back.   Please return if you have any other emergent concerns.  Additional Information:  Your vital signs today were: BP (!) 107/57    Pulse 69    Temp 99.6 F (37.6 C) (Oral)    Resp (!) 23    Ht 5\' 5"  (1.651 m)    Wt 61.2 kg    SpO2 97%    BMI 22.47 kg/m  If your blood pressure (BP) was elevated above 135/85 this visit, please have this repeated by your doctor within one month. --------------

## 2020-05-27 NOTE — ED Notes (Signed)
Patient was 90% on RA, but appeared SOB. Placed on [facility]. SAT now 95%

## 2020-05-27 NOTE — ED Triage Notes (Signed)
Pt arrives with daughter (lives with granddaughter) reports EMS came to the home last night to check on patient because she was having chills reports that she did not have a fever and did not go to the hospital. Pt c/o pain in both legs, denies injury.

## 2020-05-27 NOTE — ED Provider Notes (Signed)
[facility] EMERGENCY DEPARTMENT Provider Note   CSN: [id number] Arrival date & time: 05/27/20  1107     History Chief Complaint  Patient presents with  . Fever    Krista Joseph is a 87 y.o. female.  Patient with history of bladder cancer, hypertension --presents the emergency department today for evaluation of chills and fever.  Patient lives at home with family.  She began having some chills yesterday.  No other localizing symptoms.  Today she was having worsening shaking chills and was brought to the hospital for evaluation.  No URI symptoms, cough, shortness of breath, chest pain, nausea, vomiting, diarrhea, abdominal pain or urinary symptoms.  No lower extremity swelling or skin rashes noted.  Patient is vaccinated against Covid.  She was around a grandchild who had "a cold" recently.  Otherwise no known sick contacts.  Patient was 90% oxygen saturation on room air on arrival with a temperature of 103.3 F.        Past Medical History:  Diagnosis Date  . Acid reflux   . Arthritis   . Bronchitis   . Coronary artery disease   . Hypercholesteremia   . Hyperlipidemia   . Hypertension     There are no problems to display for this patient.   Past Surgical History:  Procedure Laterality Date  . ABDOMINAL HYSTERECTOMY    . VAGINAL HYSTERECTOMY       OB History    Gravida  4   Para  4   Term  4   Preterm      AB      Living        SAB      IAB      Ectopic      Multiple      Live Births              No family history on file.  Social History   Tobacco Use  . Smoking status: Former Smoker    Packs/day: 0.25    Years: 1.00    Pack years: 0.25    Types: Cigarettes    Quit date: 09/27/1961    Years since quitting: 58.7  . Smokeless tobacco: Never Used  Substance Use Topics  . Alcohol use: No  . Drug use: No    Home Medications Prior to Admission medications   Medication Sig Start Date End Date Taking? Authorizing Provider   amLODipine (NORVASC) 5 MG tablet Take 5 mg by mouth daily.    [provider]  amoxicillin-clavulanate (AUGMENTIN) 875-125 MG tablet Take 1 tablet by mouth 2 (two) times daily. One po bid x 7 days 05/01/17   Charlesetta Shanks, MD  apixaban (ELIQUIS) 5 MG TABS tablet Take 5 mg by mouth 2 (two) times daily.    [provider]  aspirin 81 MG tablet Take 81 mg by mouth daily.    [provider]  atorvastatin (LIPITOR) 20 MG tablet Take 20 mg by mouth daily.    [provider]  furosemide (LASIX) 20 MG tablet Take 20 mg by mouth 2 (two) times daily.    [provider]  oxyCODONE-acetaminophen (PERCOCET/ROXICET) 5-325 MG tablet Take by mouth every 4 (four) hours as needed for severe pain.    [provider]  phenazopyridine (PYRIDIUM) 200 MG tablet Take 1 tablet (200 mg total) by mouth 3 (three) times daily as needed for pain. 05/01/17   Charlesetta Shanks, MD  Pitavastatin Calcium (LIVALO) 2 MG TABS Take by  mouth.    [provider]    Allergies    Ivp dye [iodinated diagnostic agents]  Review of Systems   Review of Systems  Constitutional: Positive for chills and fever.  HENT: Negative for rhinorrhea and sore throat.   Eyes: Negative for redness.  Respiratory: Negative for cough and shortness of breath.   Cardiovascular: Negative for chest pain.  Gastrointestinal: Negative for abdominal pain, diarrhea, nausea and vomiting.  Genitourinary: Negative for dysuria, frequency, hematuria and urgency.  Musculoskeletal: Negative for myalgias.  Skin: Negative for rash.  Neurological: Negative for headaches.    Physical Exam Updated Vital Signs BP (!) 113/51 (BP Location: Right Arm)   Pulse 72   Temp (!) 102.2 F (39 C) (Oral)   Resp (!) 22   Ht 5\' 5"  (1.651 m)   Wt 61.2 kg   SpO2 94%   BMI 22.47 kg/m   Physical Exam Vitals and nursing note reviewed.  Constitutional:      General: She is not in acute distress.    Appearance:  She is well-developed.     Comments: Patient is warm to touch.  HENT:     Head: Normocephalic and atraumatic.     Right Ear: External ear normal.     Left Ear: External ear normal.     Nose: Nose normal.  Eyes:     Conjunctiva/sclera: Conjunctivae normal.  Cardiovascular:     Rate and Rhythm: Normal rate and regular rhythm.     Heart sounds: No murmur heard.   Pulmonary:     Effort: No respiratory distress.     Breath sounds: No wheezing, rhonchi or rales.     Comments: No respiratory distress. Abdominal:     Palpations: Abdomen is soft.     Tenderness: There is no abdominal tenderness. There is no guarding or rebound.  Musculoskeletal:     Cervical back: Normal range of motion and neck supple.     Right lower leg: No edema.     Left lower leg: No edema.  Skin:    General: Skin is warm and dry.     Findings: No rash.  Neurological:     General: No focal deficit present.     Mental Status: She is alert.     Motor: No weakness.  Psychiatric:        Mood and Affect: Mood normal.     ED Results / Procedures / Treatments   Labs (all labs ordered are listed, but only abnormal results are displayed) Labs Reviewed  COMPREHENSIVE METABOLIC PANEL - Abnormal; Notable for the following components:      Result Value   Glucose, Bld 102 (*)    BUN 33 (*)    Creatinine, Ser 1.54 (*)    Calcium 8.5 (*)    Albumin 3.3 (*)    AST 77 (*)    GFR, Estimated 32 (*)    All other components within normal limits  CBC WITH DIFFERENTIAL/PLATELET - Abnormal; Notable for the following components:   WBC 13.4 (*)    RBC 3.45 (*)    Hemoglobin 11.0 (*)    HCT 33.3 (*)    Neutro Abs 11.9 (*)    Lymphs Abs 0.4 (*)    Abs Immature Granulocytes 0.29 (*)    All other components within normal limits  PROTIME-INR - Abnormal; Notable for the following components:   Prothrombin Time 18.6 (*)    INR 1.6 (*)    All other components within normal limits  APTT - Abnormal; Notable for the following  components:   aPTT 40 (*)    All other components within normal limits  URINALYSIS, ROUTINE W REFLEX MICROSCOPIC - Abnormal; Notable for the following components:   APPearance CLOUDY (*)    Hgb urine dipstick LARGE (*)    Protein, ur 100 (*)    Leukocytes,Ua MODERATE (*)    All other components within normal limits  URINALYSIS, MICROSCOPIC (REFLEX) - Abnormal; Notable for the following components:   Bacteria, UA MANY (*)    All other components within normal limits  RESP PANEL BY RT-PCR (FLU A&B, COVID) ARPGX2  URINE CULTURE  CULTURE, BLOOD (ROUTINE X 2)  CULTURE, BLOOD (ROUTINE X 2)  LACTIC ACID, PLASMA  LACTIC ACID, PLASMA    EKG EKG Interpretation  Date/Time:  Friday May 27 2020 11:39:34 EST Ventricular Rate:  61 PR Interval:    QRS Duration: 89 QT Interval:  466 QTC Calculation: 470 R Axis:   20 Text Interpretation: Sinus rhythm Atrial premature complexes Minimal ST elevation, anterior leads When compared to prior, similar apperance,. No STEMI Confirmed by Antony Blackbird [id number]) on 05/27/2020 1:25:24 PM   Radiology DG Chest Port 1 View  Result Date: 05/27/2020 CLINICAL DATA:  Possible sepsis EXAM: PORTABLE CHEST 1 VIEW COMPARISON:  2014 FINDINGS: Persistent mild elevation of the right hemidiaphragm. Central pulmonary vascular congestion. No significant pleural effusion. Cardiomegaly. IMPRESSION: Cardiomegaly and central pulmonary vascular congestion. Electronically Signed   By: Macy Mis M.D.   On: 05/27/2020 12:46    Procedures Procedures (including critical care time)  Medications Ordered in ED Medications  acetaminophen (TYLENOL) tablet 650 mg (650 mg Oral Given 05/27/20 1121)  cefTRIAXone (ROCEPHIN) 1 g in sodium chloride 0.9 % 100 mL IVPB (1 g Intravenous New Bag/Given 05/27/20 1346)    ED Course  I have reviewed the triage vital signs and the nursing notes.  Pertinent labs & imaging results that were available during my care of the patient were reviewed  by me and considered in my medical decision making (see chart for details).  Patient seen and examined. Work-up initiated.  Tylenol ordered.  Chest x-ray is clear.  Awaiting lab work-up.  Patient is in no distress at this time.  She is on 2 L nasal cannula.  Vital signs reviewed and are as follows: BP (!) 113/51 (BP Location: Right Arm)   Pulse 72   Temp (!) 102.2 F (39 C) (Oral)   Resp (!) 22   Ht 5\' 5"  (1.651 m)   Wt 61.2 kg   SpO2 94%   BMI 22.47 kg/m   3:04 PM Patient rechecked multiple times. Continuing to look and do well.  Patient discussed with and seen by Dr. Sherry Ruffing.  Patient and family would like her to go home today and avoid being in the hospital and exposed to Covid.  Patient was taken off oxygen and maintains oxygen saturation above 90%.  Plan for discharge to home on Cefpodoxime.   We discussed signs and symptoms to return including development of persistent vomiting, severe pain, high persistent fever, confusion or altered mental status.  We also discussed the possibility that if her blood cultures come back positive, she would need to return to be admitted to the hospital for IV antibiotics.  Patient's daughter has already set up an appointment next Monday for the patient at her primary care doctor.  Family is comfortable discharged home as well.    MDM Rules/Calculators/A&P  Patient with acute febrile illness likely due to UTI.  Cultures pending.  Elevated white count noted.  Not tachycardic.  Fever resolved with Tylenol.  She is not vomiting.  She otherwise looks well.  Negative Covid test.  Chest x-ray clear.  Normal lactic acid.  Baseline chronic kidney disease.  Plan for trial of outpatient therapy with strict return instructions as above and close follow-up with PCP.  Family involved in the patient's care.  They seem reliable to return if symptoms do get worse.   Final Clinical Impression(s) / ED Diagnoses Final diagnoses:  Acute  cystitis without hematuria  Febrile illness    Rx / DC Orders ED Discharge Orders         Ordered    cefpodoxime (VANTIN) 200 MG tablet  2 times daily        05/27/20 1501           Carlisle Cater, PA-C 05/27/20 1507    Tegeler, Gwenyth Allegra, MD 05/28/20 1224

## 2020-05-29 LAB — URINE CULTURE: Culture: >=100000 — AB

## 2020-05-30 NOTE — Progress Notes (Signed)
ED Antimicrobial Stewardship Positive Culture Follow Up   Krista Joseph is an 87 y.o. female who presented to [facility] on 05/27/2020 with a chief complaint of  Chief Complaint  Patient presents with   Fever    Recent Results (from the past 720 hour(s))  Blood culture (routine x 2)     Status: None (Preliminary result)   Collection Time: 05/27/20 11:52 AM   Specimen: BLOOD  Result Value Ref Range Status   Specimen Description   Final    BLOOD RIGHT ANTECUBITAL Performed at [facility], [address] [place], [place] [address]    Special Requests   Final    BOTTLES DRAWN AEROBIC AND ANAEROBIC Blood Culture adequate volume Performed at [facility], [address] [place], [place] [address]    Culture   Final    NO GROWTH 2 DAYS Performed at [facility], [place] [address], [place], [place] [address]    Report Status PENDING  Incomplete  Blood culture (routine x 2)     Status: None (Preliminary result)   Collection Time: 05/27/20 11:58 AM   Specimen: BLOOD LEFT FOREARM  Result Value Ref Range Status   Specimen Description   Final    BLOOD LEFT FOREARM Performed at [facility], [place] [address], [place], [place] [address]    Special Requests   Final    BOTTLES DRAWN AEROBIC AND ANAEROBIC Blood Culture adequate volume Performed at [facility], [address] [place], [place] [address]    Culture   Final    NO GROWTH 2 DAYS Performed at [facility], [place] [address], [place], [place] [address]    Report Status PENDING  Incomplete  Resp Panel by RT-PCR (Flu A&B, Covid) Nasopharyngeal Swab     Status: None   Collection Time: 05/27/20 12:00 PM   Specimen: Nasopharyngeal Swab; Nasopharyngeal(NP) swabs in vial transport medium  Result Value Ref Range Status   SARS Coronavirus 2 by RT PCR NEGATIVE NEGATIVE Final    Comment: (NOTE) SARS-CoV-2 target nucleic acids are NOT DETECTED.  The SARS-CoV-2 RNA is generally detectable  in upper respiratory specimens during the acute phase of infection. The lowest concentration of SARS-CoV-2 viral copies this assay can detect is 138 copies/mL. A negative result does not preclude SARS-Cov-2 infection and should not be used as the sole basis for treatment or other patient management decisions. A negative result may occur with  improper specimen collection/handling, submission of specimen other than nasopharyngeal swab, presence of viral mutation(s) within the areas targeted by this assay, and inadequate number of viral copies(<138 copies/mL). A negative result must be combined with clinical observations, patient history, and epidemiological information. The expected result is Negative.  Fact Sheet for Patients:  EntrepreneurPulse.com.au  Fact Sheet for Healthcare Providers:  IncredibleEmployment.be  This test is no t yet approved or cleared by the [place] FDA and  has been authorized for detection and/or diagnosis of SARS-CoV-2 by FDA under an Emergency Use Authorization (EUA). This EUA will remain  in effect (meaning this test can be used) for the duration of the COVID-19 declaration under Section 564(b)(1) of the Act, 21 U.S.C.section 360bbb-3(b)(1), unless the authorization is terminated  or revoked sooner.       Influenza A by PCR NEGATIVE NEGATIVE Final   Influenza B by PCR NEGATIVE NEGATIVE Final    Comment: (NOTE) The Xpert Xpress SARS-CoV-2/FLU/RSV plus assay is intended as an  aid in the diagnosis of influenza from Nasopharyngeal swab specimens and should not be used as a sole basis for treatment. Nasal washings and aspirates are unacceptable for Xpert Xpress SARS-CoV-2/FLU/RSV testing.  Fact Sheet for Patients: EntrepreneurPulse.com.au  Fact Sheet for Healthcare Providers: IncredibleEmployment.be  This test is not yet approved or cleared by the [place] FDA and has  been authorized for detection and/or diagnosis of SARS-CoV-2 by FDA under an Emergency Use Authorization (EUA). This EUA will remain in effect (meaning this test can be used) for the duration of the COVID-19 declaration under Section 564(b)(1) of the Act, 21 U.S.C. section 360bbb-3(b)(1), unless the authorization is terminated or revoked.  Performed at [facility], [address] [place], [place] [address]   Urine culture     Status: Abnormal   Collection Time: 05/27/20 12:11 PM   Specimen: In/Out Cath Urine  Result Value Ref Range Status   Specimen Description   Final    IN/OUT CATH URINE Performed at [facility], [address] [place], [place] [address]    Special Requests   Final    NONE Performed at [facility], [address] [place], [place] [address]    Culture (A)  Final    >=100,000 COLONIES/mL ESCHERICHIA COLI Confirmed Extended Spectrum Beta-Lactamase Producer (ESBL).  In bloodstream infections from ESBL organisms, carbapenems are preferred over piperacillin/tazobactam. They are shown to have a lower risk of mortality.    Report Status 05/29/2020 FINAL  Final   Organism ID, Bacteria ESCHERICHIA COLI (A)  Final      Susceptibility   Escherichia coli - MIC*    AMPICILLIN >=32 RESISTANT Resistant     CEFAZOLIN >=64 RESISTANT Resistant     CEFEPIME 4 INTERMEDIATE Intermediate     CEFTRIAXONE >=64 RESISTANT Resistant     CIPROFLOXACIN >=4 RESISTANT Resistant     GENTAMICIN >=16 RESISTANT Resistant     IMIPENEM <=0.25 SENSITIVE Sensitive     NITROFURANTOIN 64 INTERMEDIATE Intermediate     TRIMETH/SULFA <=20 SENSITIVE Sensitive     AMPICILLIN/SULBACTAM >=32 RESISTANT Resistant     PIP/TAZO <=4 SENSITIVE Sensitive     * >=100,000 COLONIES/mL ESCHERICHIA COLI    [x]  Treated with cefpodoxime, organism resistant to prescribed antimicrobial  New antibiotic prescription: Flow Manager to call patient. If patient feeling better with no  further symptoms, no further treatment indicated. If patient still having symptoms, start Bactrim 1 single strength tablet PO q12h x 5 days. Stop cefpodoxime.  ED Provider: Theotis Burrow, MD   Margretta Sidle Dohlen 05/30/2020, 8:17 AM Clinical Pharmacist Monday - Friday phone -  [phone] Saturday - Sunday phone - [phone]

## 2020-06-01 LAB — CULTURE, BLOOD (ROUTINE X 2)
Culture: NO GROWTH
Culture: NO GROWTH
Special Requests: ADEQUATE
Special Requests: ADEQUATE

## 2021-05-31 ENCOUNTER — Other Ambulatory Visit: Payer: Self-pay

## 2021-05-31 ENCOUNTER — Emergency Department (HOSPITAL_BASED_OUTPATIENT_CLINIC_OR_DEPARTMENT_OTHER)
Admission: EM | Admit: 2021-05-31 | Discharge: 2021-05-31 | Disposition: A | Discharge status: Home / Self Care | Payer: Medicare HMO | Attending: Emergency Medicine | Admitting: Emergency Medicine

## 2021-05-31 ENCOUNTER — Other Ambulatory Visit (HOSPITAL_BASED_OUTPATIENT_CLINIC_OR_DEPARTMENT_OTHER): Payer: Self-pay

## 2021-05-31 ENCOUNTER — Encounter (HOSPITAL_BASED_OUTPATIENT_CLINIC_OR_DEPARTMENT_OTHER): Payer: Self-pay

## 2021-05-31 DIAGNOSIS — N390 Urinary tract infection, site not specified: Secondary | ICD-10-CM | POA: Diagnosis not present

## 2021-05-31 DIAGNOSIS — Z79899 Other long term (current) drug therapy: Secondary | ICD-10-CM | POA: Diagnosis not present

## 2021-05-31 DIAGNOSIS — Z7901 Long term (current) use of anticoagulants: Secondary | ICD-10-CM | POA: Diagnosis not present

## 2021-05-31 DIAGNOSIS — Z20822 Contact with and (suspected) exposure to covid-19: Secondary | ICD-10-CM | POA: Insufficient documentation

## 2021-05-31 DIAGNOSIS — R531 Weakness: Secondary | ICD-10-CM | POA: Diagnosis present

## 2021-05-31 DIAGNOSIS — Z7982 Long term (current) use of aspirin: Secondary | ICD-10-CM | POA: Insufficient documentation

## 2021-05-31 LAB — CBC
HCT: 33.1 % — ABNORMAL LOW (ref 36.0–46.0)
Hemoglobin: 10.9 g/dL — ABNORMAL LOW (ref 12.0–15.0)
MCH: 31.2 pg (ref 26.0–34.0)
MCHC: 32.9 g/dL (ref 30.0–36.0)
MCV: 94.8 fL (ref 80.0–100.0)
Platelets: 252 10*3/uL (ref 150–400)
RBC: 3.49 MIL/uL — ABNORMAL LOW (ref 3.87–5.11)
RDW: 12.2 % (ref 11.5–15.5)
WBC: 6.7 10*3/uL (ref 4.0–10.5)
nRBC: 0 % (ref 0.0–0.2)

## 2021-05-31 LAB — URINALYSIS, ROUTINE W REFLEX MICROSCOPIC
Bilirubin Urine: NEGATIVE
Glucose, UA: NEGATIVE mg/dL
Ketones, ur: NEGATIVE mg/dL
Nitrite: POSITIVE — AB
Protein, ur: 30 mg/dL — AB
Specific Gravity, Urine: 1.02 (ref 1.005–1.030)
pH: 5.5 (ref 5.0–8.0)

## 2021-05-31 LAB — BASIC METABOLIC PANEL
Anion gap: 11 (ref 5–15)
BUN: 19 mg/dL (ref 8–23)
CO2: 23 mmol/L (ref 22–32)
Calcium: 8.6 mg/dL — ABNORMAL LOW (ref 8.9–10.3)
Chloride: 105 mmol/L (ref 98–111)
Creatinine, Ser: 1.04 mg/dL — ABNORMAL HIGH (ref 0.44–1.00)
GFR, Estimated: 51 mL/min — ABNORMAL LOW (ref >60)
Glucose, Bld: 99 mg/dL (ref 70–99)
Potassium: 3.7 mmol/L (ref 3.5–5.1)
Sodium: 139 mmol/L (ref 135–145)

## 2021-05-31 LAB — URINALYSIS, MICROSCOPIC (REFLEX): WBC, UA: >50 WBC/hpf (ref 0–5)

## 2021-05-31 LAB — RESP PANEL BY RT-PCR (FLU A&B, COVID) ARPGX2
Influenza A by PCR: NEGATIVE
Influenza B by PCR: NEGATIVE
SARS Coronavirus 2 by RT PCR: NEGATIVE

## 2021-05-31 MED ORDER — SODIUM CHLORIDE 0.9 % IV SOLN
1.0000 g | Freq: Once | INTRAVENOUS | Status: AC
  Administered 2021-05-31: 1 g via INTRAVENOUS
  Filled 2021-05-31: qty 10

## 2021-05-31 MED ORDER — CEPHALEXIN 500 MG PO CAPS
500.0000 mg | ORAL_CAPSULE | Freq: Four times a day (QID) | ORAL | 0 refills | Status: AC
  Filled 2021-05-31: qty 40, 10d supply, fill #0

## 2021-05-31 NOTE — ED Notes (Signed)
Pt lives with grandson who last week had a cough. Was not tested for any viruses at the time.

## 2021-05-31 NOTE — ED Triage Notes (Signed)
Pt arrives with daughter with reports of weakness stating 'I just feel sick' Daughter reports she has had a cold since Sunday.

## 2021-05-31 NOTE — ED Provider Notes (Signed)
[facility] EMERGENCY DEPARTMENT Provider Note   CSN: [id number] Arrival date & time: 05/31/21  0946     History  Chief Complaint  Patient presents with   Weakness    Krista Joseph is a 88 y.o. female.  Pt reports she has felt bad since Sunday.  Pt had a fever and achy at home.  Pt took one tylenol this am.   The history is provided by the patient and a relative. No language interpreter was used.  Weakness Severity:  Moderate Onset quality:  Gradual Duration:  3 days Timing:  Constant Progression:  Unchanged Chronicity:  New Context: not dehydration and not recent infection   Relieved by:  Nothing Worsened by:  Nothing Ineffective treatments:  Drinking fluids Associated symptoms: cough and fever   Associated symptoms: no abdominal pain, no anorexia, no diarrhea and no vomiting   Risk factors: no diabetes and no new medications       Home Medications Prior to Admission medications   Medication Sig Start Date End Date Taking? Authorizing Provider  amLODipine (NORVASC) 5 MG tablet Take 5 mg by mouth daily.    [provider]  apixaban (ELIQUIS) 5 MG TABS tablet Take 5 mg by mouth 2 (two) times daily.    [provider]  aspirin 81 MG tablet Take 81 mg by mouth daily.    [provider]  atorvastatin (LIPITOR) 20 MG tablet Take 20 mg by mouth daily.    [provider]  cefpodoxime (VANTIN) 200 MG tablet Take 1 tablet (200 mg total) by mouth 2 (two) times daily. 05/27/20   Carlisle Cater, PA-C  furosemide (LASIX) 20 MG tablet Take 20 mg by mouth 2 (two) times daily.    [provider]  oxyCODONE-acetaminophen (PERCOCET/ROXICET) 5-325 MG tablet Take by mouth every 4 (four) hours as needed for severe pain.    [provider]  phenazopyridine (PYRIDIUM) 200 MG tablet Take 1 tablet (200 mg total) by mouth 3 (three) times daily as needed for pain. 05/01/17   Charlesetta Shanks, MD  Pitavastatin Calcium (LIVALO) 2 MG  TABS Take by mouth.    [provider]      Allergies    Ivp dye [iodinated contrast media]    Review of Systems   Review of Systems  Constitutional:  Positive for fever.  Respiratory:  Positive for cough.   Gastrointestinal:  Negative for abdominal pain, anorexia, diarrhea and vomiting.  Neurological:  Positive for weakness.  All other systems reviewed and are negative.  Physical Exam Updated Vital Signs BP (!) 130/58    Pulse (!) 56    Temp 98.3 F (36.8 C) (Oral)    Resp 20    Ht 5\' 5"  (1.651 m)    Wt 53.5 kg    SpO2 93%    BMI 19.64 kg/m  Physical Exam Vitals reviewed.  Constitutional:      Appearance: Normal appearance.  HENT:     Head: Normocephalic and atraumatic.     Mouth/Throat:     Mouth: Mucous membranes are moist.  Eyes:     Pupils: Pupils are equal, round, and reactive to light.  Cardiovascular:     Rate and Rhythm: Normal rate and regular rhythm.     Pulses: Normal pulses.  Pulmonary:     Effort: Pulmonary effort is normal.  Abdominal:     General: Abdomen is flat.     Palpations: Abdomen is soft.  Musculoskeletal:  General: Normal range of motion.     Cervical back: Normal range of motion.  Skin:    General: Skin is warm.  Neurological:     General: No focal deficit present.     Mental Status: She is alert.  Psychiatric:        Mood and Affect: Mood normal.    ED Results / Procedures / Treatments   Labs (all labs ordered are listed, but only abnormal results are displayed) Labs Reviewed  BASIC METABOLIC PANEL - Abnormal; Notable for the following components:      Result Value   Creatinine, Ser 1.04 (*)    Calcium 8.6 (*)    GFR, Estimated 51 (*)    All other components within normal limits  CBC - Abnormal; Notable for the following components:   RBC 3.49 (*)    Hemoglobin 10.9 (*)    HCT 33.1 (*)    All other components within normal limits  URINALYSIS, ROUTINE W REFLEX MICROSCOPIC - Abnormal; Notable for the following  components:   APPearance CLOUDY (*)    Hgb urine dipstick LARGE (*)    Protein, ur 30 (*)    Nitrite POSITIVE (*)    Leukocytes,Ua MODERATE (*)    All other components within normal limits  URINALYSIS, MICROSCOPIC (REFLEX) - Abnormal; Notable for the following components:   Bacteria, UA MANY (*)    All other components within normal limits  RESP PANEL BY RT-PCR (FLU A&B, COVID) ARPGX2    EKG EKG Interpretation  Date/Time:  Wednesday May 31 2021 10:00:25 EST Ventricular Rate:  58 PR Interval:  156 QRS Duration: 103 QT Interval:  446 QTC Calculation: 439 R Axis:   37 Text Interpretation: Sinus rhythm Borderline T wave abnormalities Since last tracing Premature atrial complexes NO LONGER PRESENT Confirmed by Calvert Cantor [phone]) on 05/31/2021 11:11:57 AM  Radiology No results found.  Procedures Procedures    Medications Ordered in ED Medications  cefTRIAXone (ROCEPHIN) 1 g in sodium chloride 0.9 % 100 mL IVPB (1 g Intravenous New Bag/Given 05/31/21 1137)    ED Course/ Medical Decision Making/ A&P                           Medical Decision Making Amount and/or Complexity of Data Reviewed Independent Historian:     Details: Daughter Labs: ordered. Decision-making details documented in ED Course.    Details: Ua shows wbc's greater than 50 and many bacteria covid and INfluenza negative ECG/medicine tests: ordered. Decision-making details documented in ED Course.  Risk Prescription drug management. Risk Details: Pt given rocephin 1 gram IV here  Rx for keflex 500mg  tid.  Pt advised if increased fever or vomiting return for reevalaution           Final Clinical Impression(s) / ED Diagnoses Final diagnoses:  Urinary tract infection without hematuria, site unspecified    Rx / DC Orders ED Discharge Orders          Ordered    cephALEXin (KEFLEX) 500 MG capsule  4 times daily        05/31/21 1239          An After Visit Summary was printed and  given to the patient.     Fransico Meadow, Hershal Coria 05/31/21 1239    Truddie Hidden, MD 05/31/21 [phone]

## 2021-05-31 NOTE — Discharge Instructions (Addendum)
Return if any problems.

## 2021-07-12 ENCOUNTER — Other Ambulatory Visit: Payer: Self-pay

## 2021-07-12 ENCOUNTER — Emergency Department (HOSPITAL_BASED_OUTPATIENT_CLINIC_OR_DEPARTMENT_OTHER): Payer: Medicare HMO

## 2021-07-12 ENCOUNTER — Inpatient Hospital Stay (HOSPITAL_BASED_OUTPATIENT_CLINIC_OR_DEPARTMENT_OTHER)
Admission: EM | Admit: 2021-07-12 | Discharge: 2021-07-17 | DRG: 064 | Disposition: A | Discharge status: Home Health | Payer: Medicare HMO | Attending: Internal Medicine | Admitting: Internal Medicine

## 2021-07-12 ENCOUNTER — Encounter (HOSPITAL_BASED_OUTPATIENT_CLINIC_OR_DEPARTMENT_OTHER): Payer: Self-pay

## 2021-07-12 DIAGNOSIS — R2681 Unsteadiness on feet: Secondary | ICD-10-CM | POA: Diagnosis present

## 2021-07-12 DIAGNOSIS — F0392 Unspecified dementia, unspecified severity, with psychotic disturbance: Secondary | ICD-10-CM | POA: Diagnosis present

## 2021-07-12 DIAGNOSIS — R269 Unspecified abnormalities of gait and mobility: Secondary | ICD-10-CM

## 2021-07-12 DIAGNOSIS — I63541 Cerebral infarction due to unspecified occlusion or stenosis of right cerebellar artery: Principal | ICD-10-CM | POA: Diagnosis present

## 2021-07-12 DIAGNOSIS — K219 Gastro-esophageal reflux disease without esophagitis: Secondary | ICD-10-CM | POA: Diagnosis present

## 2021-07-12 DIAGNOSIS — E785 Hyperlipidemia, unspecified: Secondary | ICD-10-CM | POA: Diagnosis present

## 2021-07-12 DIAGNOSIS — Z91041 Radiographic dye allergy status: Secondary | ICD-10-CM

## 2021-07-12 DIAGNOSIS — Z9114 Patient's other noncompliance with medication regimen: Secondary | ICD-10-CM

## 2021-07-12 DIAGNOSIS — I10 Essential (primary) hypertension: Secondary | ICD-10-CM | POA: Diagnosis present

## 2021-07-12 DIAGNOSIS — R42 Dizziness and giddiness: Secondary | ICD-10-CM

## 2021-07-12 DIAGNOSIS — C679 Malignant neoplasm of bladder, unspecified: Secondary | ICD-10-CM | POA: Diagnosis present

## 2021-07-12 DIAGNOSIS — G936 Cerebral edema: Secondary | ICD-10-CM | POA: Diagnosis present

## 2021-07-12 DIAGNOSIS — Z79899 Other long term (current) drug therapy: Secondary | ICD-10-CM

## 2021-07-12 DIAGNOSIS — Z87891 Personal history of nicotine dependence: Secondary | ICD-10-CM

## 2021-07-12 DIAGNOSIS — I480 Paroxysmal atrial fibrillation: Secondary | ICD-10-CM | POA: Diagnosis present

## 2021-07-12 DIAGNOSIS — Z7901 Long term (current) use of anticoagulants: Secondary | ICD-10-CM

## 2021-07-12 DIAGNOSIS — Z7982 Long term (current) use of aspirin: Secondary | ICD-10-CM

## 2021-07-12 DIAGNOSIS — R112 Nausea with vomiting, unspecified: Secondary | ICD-10-CM

## 2021-07-12 DIAGNOSIS — Z86718 Personal history of other venous thrombosis and embolism: Secondary | ICD-10-CM

## 2021-07-12 DIAGNOSIS — R2990 Unspecified symptoms and signs involving the nervous system: Secondary | ICD-10-CM

## 2021-07-12 DIAGNOSIS — R29810 Facial weakness: Secondary | ICD-10-CM | POA: Diagnosis present

## 2021-07-12 DIAGNOSIS — I639 Cerebral infarction, unspecified: Secondary | ICD-10-CM | POA: Diagnosis present

## 2021-07-12 DIAGNOSIS — Z923 Personal history of irradiation: Secondary | ICD-10-CM

## 2021-07-12 DIAGNOSIS — Z20822 Contact with and (suspected) exposure to covid-19: Secondary | ICD-10-CM | POA: Diagnosis present

## 2021-07-12 DIAGNOSIS — Z8551 Personal history of malignant neoplasm of bladder: Secondary | ICD-10-CM

## 2021-07-12 DIAGNOSIS — F0390 Unspecified dementia without behavioral disturbance: Secondary | ICD-10-CM | POA: Diagnosis present

## 2021-07-12 DIAGNOSIS — I2510 Atherosclerotic heart disease of native coronary artery without angina pectoris: Secondary | ICD-10-CM | POA: Diagnosis present

## 2021-07-12 DIAGNOSIS — E7800 Pure hypercholesterolemia, unspecified: Secondary | ICD-10-CM | POA: Diagnosis present

## 2021-07-12 DIAGNOSIS — Z90710 Acquired absence of both cervix and uterus: Secondary | ICD-10-CM

## 2021-07-12 LAB — CBC WITH DIFFERENTIAL/PLATELET
Abs Immature Granulocytes: 0.03 K/uL (ref 0.00–0.07)
Basophils Absolute: 0 K/uL (ref 0.0–0.1)
Basophils Relative: 0 %
Eosinophils Absolute: 0 K/uL (ref 0.0–0.5)
Eosinophils Relative: 0 %
HCT: 39.8 % (ref 36.0–46.0)
Hemoglobin: 13 g/dL (ref 12.0–15.0)
Immature Granulocytes: 0 %
Lymphocytes Relative: 7 %
Lymphs Abs: 0.6 K/uL — ABNORMAL LOW (ref 0.7–4.0)
MCH: 31.6 pg (ref 26.0–34.0)
MCHC: 32.7 g/dL (ref 30.0–36.0)
MCV: 96.8 fL (ref 80.0–100.0)
Monocytes Absolute: 0.3 K/uL (ref 0.1–1.0)
Monocytes Relative: 4 %
Neutro Abs: 7.4 K/uL (ref 1.7–7.7)
Neutrophils Relative %: 89 %
Platelets: 236 K/uL (ref 150–400)
RBC: 4.11 MIL/uL (ref 3.87–5.11)
RDW: 13 % (ref 11.5–15.5)
WBC: 8.4 K/uL (ref 4.0–10.5)
nRBC: 0 % (ref 0.0–0.2)

## 2021-07-12 LAB — URINALYSIS, ROUTINE W REFLEX MICROSCOPIC
Bilirubin Urine: NEGATIVE
Glucose, UA: NEGATIVE mg/dL
Ketones, ur: NEGATIVE mg/dL
Leukocytes,Ua: NEGATIVE
Nitrite: NEGATIVE
Protein, ur: >300 mg/dL — AB
Specific Gravity, Urine: 1.025 (ref 1.005–1.030)
pH: 6 (ref 5.0–8.0)

## 2021-07-12 LAB — BASIC METABOLIC PANEL WITH GFR
Anion gap: 9 (ref 5–15)
BUN: 18 mg/dL (ref 8–23)
CO2: 23 mmol/L (ref 22–32)
Calcium: 9.3 mg/dL (ref 8.9–10.3)
Chloride: 104 mmol/L (ref 98–111)
Creatinine, Ser: 0.9 mg/dL (ref 0.44–1.00)
GFR, Estimated: >60 mL/min
Glucose, Bld: 141 mg/dL — ABNORMAL HIGH (ref 70–99)
Potassium: 3.7 mmol/L (ref 3.5–5.1)
Sodium: 136 mmol/L (ref 135–145)

## 2021-07-12 LAB — URINALYSIS, MICROSCOPIC (REFLEX): WBC, UA: NONE SEEN WBC/hpf (ref 0–5)

## 2021-07-12 LAB — TROPONIN I (HIGH SENSITIVITY)
Troponin I (High Sensitivity): 5 ng/L
Troponin I (High Sensitivity): 8 ng/L

## 2021-07-12 LAB — RESP PANEL BY RT-PCR (FLU A&B, COVID) ARPGX2
Influenza A by PCR: NEGATIVE
Influenza B by PCR: NEGATIVE
SARS Coronavirus 2 by RT PCR: NEGATIVE

## 2021-07-12 MED ORDER — METOCLOPRAMIDE HCL 5 MG/ML IJ SOLN
5.0000 mg | Freq: Once | INTRAMUSCULAR | Status: AC
  Administered 2021-07-12: 5 mg via INTRAVENOUS
  Filled 2021-07-12: qty 2

## 2021-07-12 MED ORDER — METOCLOPRAMIDE HCL 5 MG/ML IJ SOLN
5.0000 mg | Freq: Once | INTRAMUSCULAR | Status: AC
Start: 2021-07-12 — End: 2021-07-12
  Administered 2021-07-12: 5 mg via INTRAVENOUS
  Filled 2021-07-12: qty 2

## 2021-07-12 MED ORDER — MECLIZINE HCL 25 MG PO TABS
25.0000 mg | ORAL_TABLET | Freq: Once | ORAL | Status: AC
Start: 2021-07-12 — End: 2021-07-12
  Administered 2021-07-12: 25 mg via ORAL
  Filled 2021-07-12: qty 1

## 2021-07-12 MED ORDER — SODIUM CHLORIDE 0.9 % IV BOLUS
500.0000 mL | Freq: Once | INTRAVENOUS | Status: AC
Start: 2021-07-12 — End: 2021-07-12
  Administered 2021-07-12: 500 mL via INTRAVENOUS

## 2021-07-12 NOTE — Progress Notes (Signed)
Plan of Care Note for accepted transfer   Patient: Krista Joseph MRN: [id number]   [place]: 07/12/2021  Facility requesting transfer: [facility] ED Requesting Provider: Dr. Roslynn Amble Reason for transfer: Unsteady Gait / Stroke Workup Facility course:   88 year old female with past medical history of hypertension, hyperlipidemia who presents with daughter after experiencing sudden onset intense dizziness and unsteady gait earlier in the afternoon on 2/22.  Upon evaluation in the emergency department CT imaging performed revealing chronic left cerebellar infarct.  Attempt to get patient ambulate in the emergency department resulted patient consistently falling to her left concerning for persisting neurologic deficit.  ER provider discussed case with Dr. Rory Percy with neurology who recommended work-up for toxic metabolic etiology as well as MRI brain from hospitalist service and consult neurology formally if MRI identifies an acute stroke.  Bed request placed for medical telemetry bed at [facility].  Plan of care: The patient is accepted for admission to Telemetry unit, at [facility]..    Author: Vernelle Emerald, MD 07/12/2021  Check [email or web address] for on-call coverage.  Nursing staff, Please call [facility] number on Amion as soon as patient's arrival, so appropriate admitting provider can evaluate the pt.

## 2021-07-12 NOTE — ED Notes (Signed)
Patient alert and answering questions appropriately for me however provider reports she was difficult to awaken for him.

## 2021-07-12 NOTE — ED Triage Notes (Signed)
Per pt and daughter pt x/o dizziness stared ~2pm-n/v x 1 hour-NAD-to triage in w/c

## 2021-07-12 NOTE — ED Notes (Signed)
Attempted to walk patient with provider. Patient unable to ambulate normally, listing to the left and dizzy with every step.

## 2021-07-12 NOTE — ED Provider Notes (Signed)
[facility] EMERGENCY DEPARTMENT Provider Note   CSN: [id number] Arrival date & time: 07/12/21  1714     History  Chief Complaint  Patient presents with   Dizziness    Krista Joseph is a 88 y.o. female.  Presented to the emergency department with concern for dizziness.  Daughter reports that dizziness started around 2:00 this afternoon.  Has been associated with nausea and vomiting.  Patient reports that the dizziness is worse with certain movements and is improved with rest.  Has not had prior history of dizziness or vertigo.  No lightheadedness or passing out spells.  Additional history was obtained from daughter at bedside and from review of her chart.  Daughter states that patient recently stopped taking tramadol, had been on tramadol for a very long time.  Last dose was 4 days ago.  No other medication changes recently.  Still takes Eliquis for history of DVT.  HPI     Home Medications Prior to Admission medications   Medication Sig Start Date End Date Taking? Authorizing Provider  amLODipine (NORVASC) 5 MG tablet Take 5 mg by mouth daily.    [provider]  apixaban (ELIQUIS) 5 MG TABS tablet Take 5 mg by mouth 2 (two) times daily.    [provider]  aspirin 81 MG tablet Take 81 mg by mouth daily.    [provider]  atorvastatin (LIPITOR) 20 MG tablet Take 20 mg by mouth daily.    [provider]  cefpodoxime (VANTIN) 200 MG tablet Take 1 tablet (200 mg total) by mouth 2 (two) times daily. 05/27/20   Carlisle Cater, PA-C  furosemide (LASIX) 20 MG tablet Take 20 mg by mouth 2 (two) times daily.    [provider]  oxyCODONE-acetaminophen (PERCOCET/ROXICET) 5-325 MG tablet Take by mouth every 4 (four) hours as needed for severe pain.    [provider]  phenazopyridine (PYRIDIUM) 200 MG tablet Take 1 tablet (200 mg total) by mouth 3 (three) times daily as needed for pain. 05/01/17   Charlesetta Shanks, MD   Pitavastatin Calcium (LIVALO) 2 MG TABS Take by mouth.    [provider]      Allergies    Ivp dye [iodinated contrast media]    Review of Systems   Review of Systems  Constitutional:  Negative for chills and fever.  HENT:  Negative for ear pain and sore throat.   Eyes:  Negative for pain and visual disturbance.  Respiratory:  Negative for cough and shortness of breath.   Cardiovascular:  Negative for chest pain and palpitations.  Gastrointestinal:  Positive for nausea and vomiting. Negative for abdominal pain.  Genitourinary:  Negative for dysuria and hematuria.  Musculoskeletal:  Negative for arthralgias and back pain.  Skin:  Negative for color change and rash.  Neurological:  Positive for dizziness. Negative for seizures and syncope.  All other systems reviewed and are negative.  Physical Exam Updated Vital Signs BP (!) 139/58    Pulse 68    Temp 97.8 F (36.6 C) (Oral)    Resp 19    Ht 5\' 1"  (1.549 m)    Wt 54.9 kg    SpO2 95%    BMI 22.86 kg/m  Physical Exam Vitals and nursing note reviewed.  Constitutional:      General: She is not in acute distress.    Appearance: She is well-developed.  HENT:     Head: Normocephalic and atraumatic.  Eyes:     Conjunctiva/sclera: Conjunctivae  normal.  Cardiovascular:     Rate and Rhythm: Normal rate and regular rhythm.     Heart sounds: No murmur heard. Pulmonary:     Effort: Pulmonary effort is normal. No respiratory distress.     Breath sounds: Normal breath sounds.  Abdominal:     Palpations: Abdomen is soft.     Tenderness: There is no abdominal tenderness.  Musculoskeletal:        General: No swelling.     Cervical back: Neck supple.  Skin:    General: Skin is warm and dry.     Capillary Refill: Capillary refill takes less than 2 seconds.  Neurological:     Mental Status: She is alert.     Comments: Alert, oriented x3, normal finger-nose-finger, cranial nerves II through XII intact, 5 out of 5 strength in  upper and lower extremities; significant leaning towards left on ambulation trial, unable to ambulate independently  Psychiatric:        Mood and Affect: Mood normal.    ED Results / Procedures / Treatments   Labs (all labs ordered are listed, but only abnormal results are displayed) Labs Reviewed  CBC WITH DIFFERENTIAL/PLATELET - Abnormal; Notable for the following components:      Result Value   Lymphs Abs 0.6 (*)    All other components within normal limits  BASIC METABOLIC PANEL - Abnormal; Notable for the following components:   Glucose, Bld 141 (*)    All other components within normal limits  URINALYSIS, ROUTINE W REFLEX MICROSCOPIC - Abnormal; Notable for the following components:   Hgb urine dipstick MODERATE (*)    Protein, ur >300 (*)    All other components within normal limits  URINALYSIS, MICROSCOPIC (REFLEX) - Abnormal; Notable for the following components:   Bacteria, UA RARE (*)    All other components within normal limits  RESP PANEL BY RT-PCR (FLU A&B, COVID) ARPGX2  TROPONIN I (HIGH SENSITIVITY)  TROPONIN I (HIGH SENSITIVITY)    EKG EKG Interpretation  Date/Time:  Wednesday July 12 2021 18:14:22 EST Ventricular Rate:  62 PR Interval:    QRS Duration: 119 QT Interval:  439 QTC Calculation: 446 R Axis:   43 Text Interpretation: poor baseline quality - suspect sinus rhythm Nonspecific intraventricular conduction delay Borderline repolarization abnormality Baseline wander in lead(s) V1 Confirmed by Madalyn Rob [phone]) on 07/12/2021 6:42:02 PM  Radiology CT Head Wo Contrast  Result Date: 07/12/2021 CLINICAL DATA:  Vertigo, central EXAM: CT HEAD WITHOUT CONTRAST TECHNIQUE: Contiguous axial images were obtained from the base of the skull through the vertex without intravenous contrast. RADIATION DOSE REDUCTION: This exam was performed according to the departmental dose-optimization program which includes automated exposure control, adjustment of the mA  and/or kV according to patient size and/or use of iterative reconstruction technique. COMPARISON:  CT head 06/10/2014 BRAIN: BRAIN Cerebral ventricle sizes are concordant with the degree of cerebral volume loss. Patchy and confluent areas of decreased attenuation are noted throughout the deep and periventricular white matter of the cerebral hemispheres bilaterally, compatible with chronic microvascular ischemic disease. Left cerebellar since encephalomalacia. Left basal ganglia lacunar infarction. No evidence of large-territorial acute infarction. No parenchymal hemorrhage. No mass lesion. No extra-axial collection. No mass effect or midline shift. No hydrocephalus. Basilar cisterns are patent. Vascular: No hyperdense vessel. Atherosclerotic calcifications are present within the cavernous internal carotid and vertebral arteries. Skull: No acute fracture or focal lesion. Sinuses/Orbits: Paranasal sinuses and mastoid air cells are clear. Left lens replacement. Otherwise the orbits  are unremarkable. Other: None. IMPRESSION: No acute intracranial abnormality in a patient with chronic left cerebellar infarction/injury. Electronically Signed   By: Iven Finn M.D.   On: 07/12/2021 19:20    Procedures Procedures    Medications Ordered in ED Medications  meclizine (ANTIVERT) tablet 25 mg (25 mg Oral Given 07/12/21 1826)  metoCLOPramide (REGLAN) injection 5 mg (5 mg Intravenous Given 07/12/21 1824)  sodium chloride 0.9 % bolus 500 mL (0 mLs Intravenous Stopped 07/12/21 1953)    ED Course/ Medical Decision Making/ A&P                           Medical Decision Making Amount and/or Complexity of Data Reviewed Labs: ordered. Radiology: ordered.  Risk Prescription drug management. Decision regarding hospitalization.   88 year old lady presented to the emergency room with concern for dizziness.  On exam vitals noted to be stable, no definite neuro abnormality on initial neuro exam.  EKG without acute  ischemic change, no chest pain and troponin normal.  Doubt ACS.  Electrolytes stable, no anemia.  CT head notable for remote stroke in the left cerebellum.  No acute findings.  I independently reviewed CT imaging.  Reassessed patient, she feels much better after receiving a small amount of fluids Antivert and Reglan.  However on ambulation trial, patient had significant unsteadiness in her gait and significant deviation towards the left.  Given this finding and patient's original complaint of dizziness, concern for possibility of new stroke.  I discussed the case with Dr. Malen Gauze.  He advises obtaining MRI.  If positive for stroke, request that the neurology team be recontacted at that time.  Given her new inability to walk, feel patient would benefit from admission and evaluation by PT/OT.  Will consult [facility] to request admission for further work-up.        Final Clinical Impression(s) / ED Diagnoses Final diagnoses:  Dizziness  Gait abnormality  Stroke-like symptoms  Nausea and vomiting, unspecified vomiting type    Rx / DC Orders ED Discharge Orders     None         Lucrezia Starch, MD 07/12/21 2024

## 2021-07-12 NOTE — ED Notes (Signed)
Taken to CT at this time. 

## 2021-07-12 NOTE — Progress Notes (Signed)
ON CALL PHONE CONSULT  Call from [email or web address].  At 8:20 PM.  Discussion: 88 year old with dizziness that started this afternoon.  On noncontrast head CT, age-indeterminate-likely chronic left cerebellar infarct.  Continues to have gait ataxia with leaning to the left on walking per the EDP. My recommendation is to get an MRI brain without contrast to evaluate for stroke and call 81 if that is positive for stroke.  Otherwise, work-up for toxic metabolic etiology which might be causing gait abnormality.  -- [place], MD Neurologist [facility] Pager: [phone]

## 2021-07-13 ENCOUNTER — Inpatient Hospital Stay (HOSPITAL_COMMUNITY): Payer: Medicare HMO

## 2021-07-13 ENCOUNTER — Observation Stay (HOSPITAL_COMMUNITY): Payer: Medicare HMO

## 2021-07-13 DIAGNOSIS — E7800 Pure hypercholesterolemia, unspecified: Secondary | ICD-10-CM | POA: Diagnosis present

## 2021-07-13 DIAGNOSIS — Z90710 Acquired absence of both cervix and uterus: Secondary | ICD-10-CM | POA: Diagnosis not present

## 2021-07-13 DIAGNOSIS — C679 Malignant neoplasm of bladder, unspecified: Secondary | ICD-10-CM | POA: Diagnosis not present

## 2021-07-13 DIAGNOSIS — G936 Cerebral edema: Secondary | ICD-10-CM | POA: Diagnosis present

## 2021-07-13 DIAGNOSIS — Z923 Personal history of irradiation: Secondary | ICD-10-CM | POA: Diagnosis not present

## 2021-07-13 DIAGNOSIS — F0390 Unspecified dementia without behavioral disturbance: Secondary | ICD-10-CM | POA: Diagnosis present

## 2021-07-13 DIAGNOSIS — R29810 Facial weakness: Secondary | ICD-10-CM | POA: Diagnosis present

## 2021-07-13 DIAGNOSIS — Z7982 Long term (current) use of aspirin: Secondary | ICD-10-CM | POA: Diagnosis not present

## 2021-07-13 DIAGNOSIS — I480 Paroxysmal atrial fibrillation: Secondary | ICD-10-CM | POA: Diagnosis present

## 2021-07-13 DIAGNOSIS — Z87891 Personal history of nicotine dependence: Secondary | ICD-10-CM | POA: Diagnosis not present

## 2021-07-13 DIAGNOSIS — I10 Essential (primary) hypertension: Secondary | ICD-10-CM | POA: Diagnosis present

## 2021-07-13 DIAGNOSIS — Z7901 Long term (current) use of anticoagulants: Secondary | ICD-10-CM | POA: Diagnosis not present

## 2021-07-13 DIAGNOSIS — I639 Cerebral infarction, unspecified: Secondary | ICD-10-CM

## 2021-07-13 DIAGNOSIS — K219 Gastro-esophageal reflux disease without esophagitis: Secondary | ICD-10-CM | POA: Diagnosis present

## 2021-07-13 DIAGNOSIS — R42 Dizziness and giddiness: Secondary | ICD-10-CM | POA: Diagnosis present

## 2021-07-13 DIAGNOSIS — I6389 Other cerebral infarction: Secondary | ICD-10-CM | POA: Diagnosis not present

## 2021-07-13 DIAGNOSIS — I2510 Atherosclerotic heart disease of native coronary artery without angina pectoris: Secondary | ICD-10-CM | POA: Diagnosis present

## 2021-07-13 DIAGNOSIS — Z91041 Radiographic dye allergy status: Secondary | ICD-10-CM | POA: Diagnosis not present

## 2021-07-13 DIAGNOSIS — Z9114 Patient's other noncompliance with medication regimen: Secondary | ICD-10-CM | POA: Diagnosis not present

## 2021-07-13 DIAGNOSIS — Z79899 Other long term (current) drug therapy: Secondary | ICD-10-CM | POA: Diagnosis not present

## 2021-07-13 DIAGNOSIS — E785 Hyperlipidemia, unspecified: Secondary | ICD-10-CM | POA: Diagnosis present

## 2021-07-13 DIAGNOSIS — Z20822 Contact with and (suspected) exposure to covid-19: Secondary | ICD-10-CM | POA: Diagnosis present

## 2021-07-13 DIAGNOSIS — F0392 Unspecified dementia, unspecified severity, with psychotic disturbance: Secondary | ICD-10-CM | POA: Diagnosis not present

## 2021-07-13 DIAGNOSIS — Z8551 Personal history of malignant neoplasm of bladder: Secondary | ICD-10-CM | POA: Diagnosis not present

## 2021-07-13 DIAGNOSIS — I63541 Cerebral infarction due to unspecified occlusion or stenosis of right cerebellar artery: Secondary | ICD-10-CM | POA: Diagnosis present

## 2021-07-13 DIAGNOSIS — Z86718 Personal history of other venous thrombosis and embolism: Secondary | ICD-10-CM | POA: Diagnosis not present

## 2021-07-13 LAB — LIPID PANEL
Cholesterol: 214 mg/dL — ABNORMAL HIGH (ref 0–200)
HDL: 71 mg/dL (ref >40)
LDL Cholesterol: 137 mg/dL — ABNORMAL HIGH (ref 0–99)
Total CHOL/HDL Ratio: 3 RATIO
Triglycerides: 28 mg/dL (ref <150)
VLDL: 6 mg/dL (ref 0–40)

## 2021-07-13 MED ORDER — ACETAMINOPHEN 650 MG RE SUPP: 650.0000 mg | RECTAL | Status: DC | PRN

## 2021-07-13 MED ORDER — TRAMADOL HCL 50 MG PO TABS: 50.0000 mg | ORAL_TABLET | Freq: Four times a day (QID) | ORAL | Status: DC | PRN

## 2021-07-13 MED ORDER — AMLODIPINE BESYLATE 5 MG PO TABS: 5.0000 mg | ORAL_TABLET | Freq: Every day | ORAL | Status: DC

## 2021-07-13 MED ORDER — ACETAMINOPHEN 325 MG PO TABS: 650.0000 mg | ORAL_TABLET | ORAL | Status: DC | PRN

## 2021-07-13 MED ORDER — STROKE: EARLY STAGES OF RECOVERY BOOK: Freq: Once | Status: AC

## 2021-07-13 MED ORDER — ATORVASTATIN CALCIUM 10 MG PO TABS: 20.0000 mg | ORAL_TABLET | Freq: Every day | ORAL | Status: DC

## 2021-07-13 MED ORDER — FUROSEMIDE 20 MG PO TABS
20.0000 mg | ORAL_TABLET | Freq: Two times a day (BID) | ORAL | Status: DC
Start: 2021-07-13 — End: 2021-07-13

## 2021-07-13 MED ORDER — ATORVASTATIN CALCIUM 40 MG PO TABS
40.0000 mg | ORAL_TABLET | Freq: Every day | ORAL | Status: DC
Start: 2021-07-14 — End: 2021-07-14
  Filled 2021-07-13: qty 1

## 2021-07-13 MED ORDER — ACETAMINOPHEN 160 MG/5ML PO SOLN: 650.0000 mg | ORAL | Status: DC | PRN

## 2021-07-13 MED ORDER — ASPIRIN 325 MG PO TABS
325.0000 mg | ORAL_TABLET | Freq: Every day | ORAL | Status: DC
  Administered 2021-07-14 – 2021-07-16 (×3): 325 mg via ORAL
  Filled 2021-07-13 (×3): qty 1

## 2021-07-13 MED ORDER — ASPIRIN 81 MG PO CHEW
81.0000 mg | CHEWABLE_TABLET | Freq: Every day | ORAL | Status: DC
  Administered 2021-07-13: 81 mg via ORAL
  Filled 2021-07-13: qty 1

## 2021-07-13 MED ORDER — APIXABAN 2.5 MG PO TABS
2.5000 mg | ORAL_TABLET | Freq: Two times a day (BID) | ORAL | Status: DC
Start: 2021-07-13 — End: 2021-07-13
  Administered 2021-07-13: 2.5 mg via ORAL

## 2021-07-13 MED ORDER — FUROSEMIDE 20 MG PO TABS: 20.0000 mg | ORAL_TABLET | ORAL | Status: DC

## 2021-07-13 NOTE — Assessment & Plan Note (Addendum)
MRI was significant for a large acute right PICA stroke.  Repeat CT notes no worsening.  Seen by PT who is recommending inpatient rehab, but patient declined and family wants home health.  Hold anticoagulation x1 week because of concerns for hemorrhagic transformation.  Echocardiogram unremarkable.  As per neurology original recommendations, extended monitoring given large size of stroke.  Repeat head CT is remained stable with actual mild improvement in mass effect.  Due to failure of Eliquis, patient started on Xarelto.  PT recommended skilled nursing, but patient and family declined and patient went home with home health PT.

## 2021-07-13 NOTE — ED Notes (Signed)
Report given to Pepin with [facility]

## 2021-07-13 NOTE — Consult Note (Signed)
NEURO HOSPITALIST CONSULT NOTE   Requestig physician: Dr. Tamala Julian  Reason for Consult: Large acute right cerebellar stroke  History obtained from:  Patient and Chart     HPI:                                                                                                                                          Krista Joseph is an 88 y.o. female with a PMHx of afib on eliquis, DVT, malignant neoplasm of bladder s/p TURPBT, arthritis, bronchitis, CAD, hypercholesterolemia, HLD and HTN who presented yesterday to [facility] for evaluation of dizziness that had started that afternoon. On noncontrast head CT, a chronic-appearing left cerebellar infarct was seen. On EDP exam at [facility] she exhibited gait ataxia with leaning to the left on walking. Neurology was contacted by phone and an MRI brain was recommended. MRI brain has resulted, revealing a large right cerebellar ischemic stroke.    Home medications include apixaban, ASA and atorvastatin.   Daughter is at bedside. Neither patient nor daughter were aware patient had an old L cerebellar stroke until this MRI was performed. Patient currently resting comfortably in bed and denying any acute neurological deficits at this time.   Past Medical History:  Diagnosis Date   Acid reflux    Arthritis    Bronchitis    Coronary artery disease    Hypercholesteremia    Hyperlipidemia    Hypertension     Past Surgical History:  Procedure Laterality Date   ABDOMINAL HYSTERECTOMY     VAGINAL HYSTERECTOMY      No family history on file.           Social History:  reports that she quit smoking about 59 years ago. Her smoking use included cigarettes. She has a 0.25 pack-year smoking history. She has never used smokeless tobacco. She reports that she does not drink alcohol and does not use drugs.  Allergies  Allergen Reactions   Ivp Dye [Iodinated Contrast Media] Itching and Other (See Comments)    Patient states that her  blood pressure went up.   Latex Itching    MEDICATIONS:  Outpatient medications: amLODipine (NORVASC) 5 MG tablet Take 5 mg by mouth daily.       [provider]  apixaban (ELIQUIS) 5 MG TABS tablet Take 5 mg by mouth 2 (two) times daily.       [provider]  aspirin 81 MG tablet Take 81 mg by mouth daily.       [provider]  atorvastatin (LIPITOR) 20 MG tablet Take 20 mg by mouth daily.       [provider]  cefpodoxime (VANTIN) 200 MG tablet Take 1 tablet (200 mg total) by mouth 2 (two) times daily. 05/27/20     Carlisle Cater, PA-C  furosemide (LASIX) 20 MG tablet Take 20 mg by mouth 2 (two) times daily.       [provider]  oxyCODONE-acetaminophen (PERCOCET/ROXICET) 5-325 MG tablet Take by mouth every 4 (four) hours as needed for severe pain.       [provider]  phenazopyridine (PYRIDIUM) 200 MG tablet Take 1 tablet (200 mg total) by mouth 3 (three) times daily as needed for pain. 05/01/17     Charlesetta Shanks, MD  Pitavastatin Calcium (LIVALO) 2 MG TABS Take by mouth.       [provider]      Inpatient Scheduled:   stroke: mapping our early stages of recovery book   Does not apply Once   amLODipine  5 mg Oral Daily   apixaban  2.5 mg Oral BID   aspirin  81 mg Oral Daily   atorvastatin  20 mg Oral Daily    ROS:                                                                                                                                       History obtained from the patient  General ROS: negative for - chills, fatigue, fever, night sweats, weight gain or weight loss Psychological ROS: negative for - behavioral disorder, hallucinations, memory difficulties, mood swings or suicidal ideation Ophthalmic ROS: negative for - blurry vision, double vision, eye pain or loss of vision ENT ROS: negative  for - epistaxis, nasal discharge, oral lesions, sore throat, tinnitus or vertigo Allergy and Immunology ROS: negative for - hives or itchy/watery eyes Hematological and Lymphatic ROS: negative for - bleeding problems, bruising or swollen lymph nodes Endocrine ROS: negative for - galactorrhea, hair pattern changes, polydipsia/polyuria or temperature intolerance Respiratory ROS: negative for - cough, hemoptysis, shortness of breath or wheezing Cardiovascular ROS: negative for - chest pain, dyspnea on exertion, edema or irregular heartbeat Gastrointestinal ROS: negative for - abdominal pain, diarrhea, hematemesis, nausea/vomiting or stool incontinence Genito-Urinary ROS: negative for - dysuria, hematuria, incontinence or urinary frequency/urgency Musculoskeletal ROS: negative for - joint swelling or muscular weakness Neurological ROS: as noted in HPI Dermatological ROS: negative for rash and skin lesion changes   Blood pressure (!) 139/52, pulse (!) 47, temperature 99 F (37.2  C), temperature source Oral, resp. rate 15, height 5\' 1"  (1.549 m), weight 54.9 kg, SpO2 98 %.   General Examination:                                                                                                       Physical Exam  HEENT-  Normocephalic, no lesions, without obvious abnormality.  Normal external eye and conjunctiva.   Cardiovascular- S1-S2 audible, pulses palpable throughout   Lungs-no rhonchi or wheezing noted, no excessive working breathing.  Saturations within normal limits Abdomen- All 4 quadrants palpated and nontender Extremities- Warm, dry and intact Musculoskeletal-no joint tenderness, deformity or swelling Skin-warm and dry, no hyperpigmentation, vitiligo, or suspicious lesions  Neurological Examination Mental Status: Alert and oriented to self, context and location. Unable to specify time but daughter states this is chronic. Speech fluent without evidence of aphasia.  Able to follow 3  step commands without difficulty. Cranial Nerves: II: Discs flat bilaterally; Visual fields grossly normal. PERRL III,IV, VI: ptosis not present, EOMI  V,VII: mild right lower quadrant droop, facial light touch sensation normal bilaterally VIII: hearing normal bilaterally IX,X: uvula rises symmetrically XI: bilateral shoulder shrug XII: midline tongue extension Motor: Right : Upper extremity   5/5    Left:     Upper extremity   5/5  Lower extremity   5/5     Lower extremity   5/5 Tone and bulk:normal tone throughout; no atrophy noted Sensory: Pinprick and light touch intact throughout, bilaterally Deep Tendon Reflexes: 2+ and symmetric throughout Plantars: Right: downgoing   Left: downgoing Cerebellar: Able to do finger-to-nose, rapid alternating movements and heel-to-shin test without ataxia or dysmetria, but slow and with mild action tremor of BUE Gait: deferred   Lab Results: Basic Metabolic Panel: Recent Labs  Lab 07/12/21 1743  NA 136  K 3.7  CL 104  CO2 23  GLUCOSE 141*  BUN 18  CREATININE 0.90  CALCIUM 9.3    CBC: Recent Labs  Lab 07/12/21 1743  WBC 8.4  NEUTROABS 7.4  HGB 13.0  HCT 39.8  MCV 96.8  PLT 236    Cardiac Enzymes: No results for input(s): CKTOTAL, CKMB, CKMBINDEX, TROPONINI in the last 168 hours.  Lipid Panel: No results for input(s): CHOL, TRIG, HDL, CHOLHDL, VLDL, LDLCALC in the last 168 hours.  Imaging: CT Head Wo Contrast  Result Date: 07/12/2021 CLINICAL DATA:  Vertigo, central EXAM: CT HEAD WITHOUT CONTRAST TECHNIQUE: Contiguous axial images were obtained from the base of the skull through the vertex without intravenous contrast. RADIATION DOSE REDUCTION: This exam was performed according to the departmental dose-optimization program which includes automated exposure control, adjustment of the mA and/or kV according to patient size and/or use of iterative reconstruction technique. COMPARISON:  CT head 06/10/2014 BRAIN: BRAIN Cerebral  ventricle sizes are concordant with the degree of cerebral volume loss. Patchy and confluent areas of decreased attenuation are noted throughout the deep and periventricular white matter of the cerebral hemispheres bilaterally, compatible with chronic microvascular ischemic disease. Left cerebellar since encephalomalacia. Left basal ganglia lacunar infarction. No evidence  of large-territorial acute infarction. No parenchymal hemorrhage. No mass lesion. No extra-axial collection. No mass effect or midline shift. No hydrocephalus. Basilar cisterns are patent. Vascular: No hyperdense vessel. Atherosclerotic calcifications are present within the cavernous internal carotid and vertebral arteries. Skull: No acute fracture or focal lesion. Sinuses/Orbits: Paranasal sinuses and mastoid air cells are clear. Left lens replacement. Otherwise the orbits are unremarkable. Other: None. IMPRESSION: No acute intracranial abnormality in a patient with chronic left cerebellar infarction/injury. Electronically Signed   By: Iven Finn M.D.   On: 07/12/2021 19:20   MR BRAIN WO CONTRAST  Result Date: 07/13/2021 CLINICAL DATA:  Acute neuro deficit.  Central vertigo EXAM: MRI HEAD WITHOUT CONTRAST TECHNIQUE: Multiplanar, multiecho pulse sequences of the brain and surrounding structures were obtained without intravenous contrast. COMPARISON:  CT head 07/12/2021 FINDINGS: Brain: Acute infarct right PICA territory. This is relatively large area of infarction measuring 5 cm in diameter. No other acute infarct Chronic infarct left posterior cerebellum. Moderate chronic microvascular ischemic change in the white matter. Chronic infarct left thalamus. Moderate atrophy. Negative for hemorrhage or mass. Vascular: Normal arterial flow voids Skull and upper cervical spine: No focal skeletal lesion. Sinuses/Orbits: Paranasal sinuses clear.  Left cataract extraction Other: Dermal lipoma posterior to the right mastoid sinus measuring 28 x 15  mm. IMPRESSION: Relatively large acute infarct right PICA territory without hemorrhage. Atrophy and chronic ischemic changes as described above. Electronically Signed   By: Franchot Gallo M.D.   On: 07/13/2021 14:14     Assessment: 88 year old female with a history of atrial fibrillation, on Eliquis and ASA, presenting in transfer from [facility] for evaluation of dizziness. MRI brain reveals a large right cerebellar ischemic infarction.  1. Exam reveals mild right facial droop. Otherwise with no focal weakness, sensory loss or ataxia. The lack of ataxia is surprising given the size of the right cerebellar ischemic infarction.  2. At risk for swelling of the bed of the cerebellar infarction with possible brainstem compression and/or herniation. There is a 7 day time window during which the risk for swelling is highest, which typically peaks at about day 3-4. Will need serial CT scans of the brain q24 hours to assess for stability. MRI scan revealing the infarction was performed at 1348. Repeat CT Head ordered for 2 PM tomorrow.  3. Unable to obtain CTA due to IV iodinated contrast allergy. MRA of head and neck has been ordered. 4. Stroke risk factors: CAD, hypercholesterolemia, afib, HLD and HTN  Recommendations: 1. MRA of head and neck 2. Repeat CT head at 2 PM Friday 3. Neuro checks q4h 4. Not a good candidate for permissive HTN given the size of the infarction, which increases the risk for hemorrhagic transformation.  5. Echo ordered 6. Discontinued Eliquis due to concern for high risk of hemorrhagic transformation. Continue ASA at a dose of 325 mg qd tomorrow. Discussed with Dr. Erlinda Hong.  7. Continue risk factor prevention 8. Lipid Panel/A1c ordered 9. Frequent neuro checks. Should mental status change, obtain stat CT. May need to consult NSGY for suboccipital craniectomy if there is development of significant mass effect. 10. PT/OT/SLP eval 11. Stroke team will continue to follow     Electronically signed: Dr. Kerney Elbe 07/13/2021, 3:04 PM

## 2021-07-13 NOTE — ED Notes (Signed)
Family updated as to patient's status and bed assignment at [facility] [id number]

## 2021-07-13 NOTE — H&P (Addendum)
Intermittent History and Physical    Patient: Krista Joseph YIR:[id number] DOB: 1929-12-12 DOA: 07/12/2021 DOS: the patient was seen and examined on 07/13/2021 PCP: Secundino Ginger, PA-C  Patient coming from: Transfer from [facility]  Chief Complaint:  Chief Complaint  Patient presents with   Dizziness    HPI: Krista Joseph is a 88 y.o. female with medical history significant of hypertension, hyperlipidemia, A-fib on anticoagulation, CAD, DVT, malignant neoplasm of the bladder s/p TURPBT, and GERD presents after experiencing sudden onset of dizziness that started yesterday afternoon sometime between 1:30-2 PM.  Any kind of movement seem to worsen symptoms.  At around 4 PM patient had complaints of nausea with several episodes of vomiting.  Was noticed that the patient seeming to lean toward her left side.  Mrs. Ohms had recently stopped taking tramadol about 4 to days ago after being on it for very long time and therefore family thought symptoms were secondary to the discontinuation of the medication.  Prior to that she had been in her normal state of health the ambulates without use of assistance.  Denies having any fevers, cough, or abdominal pain.  History is obtained with assistance of the patient's daughter who is present at bedside as the patient currently states she does not know why she is here.  For the last 8 months her daughter reports that she has noticed that the patient has asked repetitive questions as though she could not remember what she was just told.    On admission into the emergency department patient was noted to be afebrile with pulse 50-70s, all other vital signs maintained.  Labs are relatively unremarkable.  CT imaging of the head without contrast did not note any acute abnormality and chronic left cerebellar infarction/injury.  Case of business discussed with Dr. Malen Gauze of neurology who recommended toxic metabolic allergy as well as MRI of the brain.   Neurology requested formal consult if MRI gave concern for stroke.  Patient has been given  500 mL of normal saline IV fluids, Reglan, and meclizine.   Review of Systems: As mentioned in the history of present illness. All other systems reviewed and are negative. Past Medical History:  Diagnosis Date   Acid reflux    Arthritis    Bronchitis    Coronary artery disease    Hypercholesteremia    Hyperlipidemia    Hypertension    Past Surgical History:  Procedure Laterality Date   ABDOMINAL HYSTERECTOMY     VAGINAL HYSTERECTOMY     Social History:  reports that she quit smoking about 59 years ago. Her smoking use included cigarettes. She has a 0.25 pack-year smoking history. She has never used smokeless tobacco. She reports that she does not drink alcohol and does not use drugs.  Allergies  Allergen Reactions   Ivp Dye [Iodinated Contrast Media] Itching and Other (See Comments)    Patient states that her blood pressure went up.   Latex Itching    No family history on file.  Prior to Admission medications   Medication Sig Start Date End Date Taking? Authorizing Provider  amLODipine (NORVASC) 5 MG tablet Take 5 mg by mouth in the morning and at bedtime.   Yes [provider]  aspirin 81 MG tablet Take 81 mg by mouth daily.   Yes [provider]  ELIQUIS 2.5 MG TABS tablet Take 2.5 mg by mouth 2 (two) times daily. 07/11/21  Yes [provider]  furosemide (LASIX) 20 MG tablet Take  20 mg by mouth every other day.   Yes [provider]  naproxen sodium (ALEVE) 220 MG tablet Take 220 mg by mouth daily as needed (pain).   Yes [provider]  traMADol (ULTRAM) 50 MG tablet Take 50 mg by mouth every 6 (six) hours as needed for pain. 07/09/21  Yes [provider]  Vitamin D, Ergocalciferol, (DRISDOL) 1.25 MG (50000 UNIT) CAPS capsule Take 50,000 Units by mouth once a week. 07/09/21  Yes [provider]  atorvastatin (LIPITOR) 20 MG  tablet Take 20 mg by mouth daily. Patient not taking: Reported on 07/13/2021    [provider]    Physical Exam: Vitals:   07/13/21 0800 07/13/21 0900 07/13/21 1005 07/13/21 1128  BP: (!) 122/44 (!) 151/62  (!) 139/52  Pulse: (!) 57 63  (!) 47  Resp: 16 (!) 25  15  Temp:   98.7 F (37.1 C) 99 F (37.2 C)  TempSrc:   Oral Oral  SpO2: 94% 97%  98%  Weight:      Height:        Constitutional: Elderly female who is currently resting, but easily awakens and is able to follow commands Eyes: PERRL, lids and conjunctivae normal ENMT: Mucous membranes are moist. Posterior pharynx clear of any exudate or lesions.  Neck: normal, supple, no masses, no thyromegaly Respiratory: clear to auscultation bilaterally, no wheezing, no crackles. Normal respiratory effort. No accessory muscle use.  Cardiovascular: Regular rate and rhythm, no murmurs / rubs / gallops. No extremity edema.  Abdomen: no tenderness, no masses palpated. Bowel sounds positive.  Musculoskeletal: no clubbing / cyanosis. No joint deformity upper and lower extremities. Good ROM, no contractures. Normal muscle tone.  Skin: no rashes, lesions, ulcers. No induration Neurologic: CN 2-12 grossly intact.  Speech is intact.  Strength appears to be intact. Psychiatric: Poor recent memory alert and oriented x person and place. Normal mood.    Data Reviewed:   EKG appears to show sinus rhythm 62 bpm but possibly is atrial fibrillation and due to significant background artifact  Assessment and Plan: * CVA (cerebral vascular accident) ([place])- (present on admission) Patient presented with complaints of dizziness since yesterday afternoon around 2 PM.  Initially CT scan of the brain showed no acute abnormality and a chronic left cerebellar infarct.  Subsequently MRI was significant for a large acute right PICA stroke. -Admit to a telemetry bed -Neurochecks -Stroke order set utilized -Check hemoglobin A1c and lipid panel -PT/OT to  evaluate and treat -Appreciate neurology consultative services, we will follow-up for further recommendation  Malignant neoplasm of bladder ([place])- (present on admission) Initially diagnosed with high-grade invasive urothelial carcinoma in 2018.  Patient underwent TURBT, but repeat biopsies in 2019 showed return of cancer.  She is felt to be a surgical candidate at that time and underwent treatment with Pembro every 3 weeks started in 10/2017 with palliative intent.  Patient had did well until progression of the mass into her pelvis for which palliative radiation of the pelvic mass was performed mid 2020.  Patient has been off therapy since that time. -Continue outpatient follow-up with hematology oncology to [facility]  Paroxysmal atrial fibrillation [facility])- (present on admission) Patient has a history of paroxysmal atrial fibrillation on chronic anticoagulation of Eliquis.  She appears to be rate controlled at this time. -Orders were initially placed to continue Eliquis, but after imaging noted size of a large stroke was held due to possible hemorrhagic conversion  Hyperlipidemia- (present on admission) Home  regimen included atorvastatin 20 mg daily. -Check lipid panel -Goal LDL less than 70.  Will increase atorvastatin to 40 mg daily if not at goal   Essential hypertension- (present on admission) Home blood pressure regimen includes amlodipine 5 mg daily and furosemide 20 mg every other day. -Allowing for permissive hypertension at this time -Resume home blood pressure regimen when medically appropriate     DVT prophylaxis: Eliquis discontinued Advance Care Planning:   Code Status: Full Code   Consults: Neurology  Family Communication: Daughter and granddaughter updated at bedside  Severity of Illness: The appropriate patient status for this patient is INPATIENT. Inpatient status is judged to be reasonable and necessary in order to provide the required intensity of service to ensure the  patient's safety. The patient's presenting symptoms, physical exam findings, and initial radiographic and laboratory data in the context of their chronic comorbidities is felt to place them at high risk for further clinical deterioration. Furthermore, it is not anticipated that the patient will be medically stable for discharge from the hospital within 2 midnights of admission.   * I certify that at the point of admission it is my clinical judgment that the patient will require inpatient hospital care spanning beyond 2 midnights from the point of admission due to high intensity of service, high risk for further deterioration and high frequency of surveillance required.*  Author: Norval Morton, MD 07/13/2021 12:29 PM  For on call review [email or web address].

## 2021-07-13 NOTE — Evaluation (Signed)
Physical Therapy Evaluation Patient Details Name: Krista Joseph MRN: [id number] DOB: 11/28/1929 Today's Date: 07/13/2021  History of Present Illness  Patient is a 88 y/o female admitted on 07/12/20 with N/V and dizziness. Head CT-remote stroke left cerebellum. Brain MRI- Relatively large acute infarct right PICA territory. PMH includes CAD, HTN.  Clinical Impression  Patient presents with left sided weakness, incoordination, impaired balance, baseline cognitive deficits and impaired mobility s/p above. Pt independent and lives with family PTA. Per family, pt has been showing dementia type behaviors in the last year. Today, pt requires min-Mod A for transfers and ambulation with listing to left/right demonstrating incoordination and balance deficits. Pt with poor awareness of balance and overall safety putting her at increased risk for falls. Would benefit from AIR to maximize independence and mobility prior to return home. Pt very motivated, has good family support and is far from functional baseline. Will follow acutely.       Recommendations for follow up therapy are one component of a multi-disciplinary discharge planning process, led by the attending physician.  Recommendations may be updated based on patient status, additional functional criteria and insurance authorization.  Follow Up Recommendations Acute inpatient rehab (3hours/day)    Assistance Recommended at Discharge Frequent or constant Supervision/Assistance  Patient can return home with the following  A little help with walking and/or transfers;A little help with bathing/dressing/bathroom;Assistance with cooking/housework;Direct supervision/assist for financial management;Assist for transportation;Help with stairs or ramp for entrance;Direct supervision/assist for medications management    Equipment Recommendations Other (comment) (TBA)  Recommendations for Other Services       Functional Status Assessment Patient has had a  recent decline in their functional status and demonstrates the ability to make significant improvements in function in a reasonable and predictable amount of time.     Precautions / Restrictions Precautions Precautions: Fall Restrictions Weight Bearing Restrictions: No      Mobility  Bed Mobility Overal bed mobility: Needs Assistance Bed Mobility: Supine to Sit     Supine to sit: Supervision, HOB elevated     General bed mobility comments: Use of rail, supervision for safety.    Transfers Overall transfer level: Needs assistance Equipment used: 1 person hand held assist Transfers: Sit to/from Stand Sit to Stand: Min assist           General transfer comment: Min A to steady in standing/leaning toward left.    Ambulation/Gait Ambulation/Gait assistance: Min assist Gait Distance (Feet): 150 Feet Assistive device: 1 person hand held assist Gait Pattern/deviations: Ataxic, Staggering left, Staggering right, Narrow base of support, Decreased step length - right, Decreased step length - left, Step-through pattern       General Gait Details: Unsteady, ataxic like gait listing to left and right with narrow BoS, needing Min-Mod A for support. Cues to decrease speed.  Stairs            Wheelchair Mobility    Modified Rankin (Stroke Patients Only) Modified Rankin (Stroke Patients Only) Pre-Morbid Rankin Score: Slight disability Modified Rankin: Moderately severe disability     Balance Overall balance assessment: Needs assistance Sitting-balance support: Feet supported, No upper extremity supported Sitting balance-Leahy Scale: Fair     Standing balance support: During functional activity Standing balance-Leahy Scale: Fair Standing balance comment: Requires external support in standing for walking, able to brush teeth at sink wtih close min guard.  Pertinent Vitals/Pain Pain Assessment Pain Assessment: No/denies pain     Home Living Family/patient expects to be discharged to:: Private residence Living Arrangements: Other relatives (grandchildren) Available Help at Discharge: Family;Available 24 hours/day Type of Home: House Home Access: Stairs to enter Entrance Stairs-Rails: Psychiatric nurse of Steps: 1 flight + 5   Home Layout: One level Home Equipment: Shower seat Additional Comments: per family, pt has been showing signs of dementia for the last year affecting her memory.    Prior Function Prior Level of Function : Independent/Modified Independent             Mobility Comments: no driving, independent, cooks/cleans, works in garden, no falls last 6 months ADLs Comments: independent     Hand Dominance   Dominant Hand: Right    Extremity/Trunk Assessment   Upper Extremity Assessment Upper Extremity Assessment: Defer to OT evaluation RUE Deficits / Details: WFL strength. Poor coordination as seen during finger opposition and alternating UE movement. RUE Coordination: decreased fine motor;decreased gross motor LUE Deficits / Details: WFL strength. Poor coordination as seen during finger opposition and alternating UE movement. LUE Coordination: decreased fine motor;decreased gross motor    Lower Extremity Assessment Lower Extremity Assessment: LLE deficits/detail;RLE deficits/detail RLE Deficits / Details: Grossly ~5/5 throughout RLE Coordination: decreased fine motor;decreased gross motor LLE Deficits / Details: Giveway noted with MMT for knee extension, decreased AROM hip flexion grossly ~2+/5. ankle 5/5. LLE Sensation: WNL LLE Coordination: decreased fine motor;decreased gross motor       Communication   Communication: No difficulties  Cognition Arousal/Alertness: Awake/alert Behavior During Therapy: WFL for tasks assessed/performed Overall Cognitive Status: Impaired/Different from baseline Area of Impairment: Orientation, Attention, Memory,  Safety/judgement, Following commands, Awareness, Problem solving                 Orientation Level: Disoriented to, Situation, Time Current Attention Level: Sustained, Selective Memory: Decreased short-term memory Following Commands: Follows one step commands with increased time Safety/Judgement: Decreased awareness of safety, Decreased awareness of deficits Awareness: Emergent Problem Solving: Slow processing, Requires verbal cues General Comments: Family reporting they have noticed some dementia like behavior for the last year. No awareness of balance deficits.        General Comments General comments (skin integrity, edema, etc.): Daughter and granddaughter present during session.    Exercises     Assessment/Plan    PT Assessment Patient needs continued PT services  PT Problem List Decreased range of motion;Decreased strength;Decreased mobility;Decreased safety awareness;Decreased coordination;Decreased balance;Decreased cognition       PT Treatment Interventions Therapeutic exercise;Balance training;Gait training;DME instruction;Neuromuscular re-education;Stair training;Patient/family education;Therapeutic activities;Functional mobility training    PT Goals (Current goals can be found in the Care Plan section)  Acute Rehab PT Goals Patient Stated Goal: to go home PT Goal Formulation: With patient Time For Goal Achievement: 07/27/21 Potential to Achieve Goals: Fair    Frequency Min 4X/week     Co-evaluation PT/OT/SLP Co-Evaluation/Treatment: Yes Reason for Co-Treatment: To address functional/ADL transfers;For patient/therapist safety PT goals addressed during session: Mobility/safety with mobility;Balance OT goals addressed during session: ADL's and self-care       AM-PAC PT "6 Clicks" Mobility  Outcome Measure Help needed turning from your back to your side while in a flat bed without using bedrails?: A Little Help needed moving from lying on your back to  sitting on the side of a flat bed without using bedrails?: A Little Help needed moving to and from a bed to a chair (including a wheelchair)?: A  Little Help needed standing up from a chair using your arms (e.g., wheelchair or bedside chair)?: A Little Help needed to walk in hospital room?: A Lot Help needed climbing 3-5 steps with a railing? : A Little 6 Click Score: 17    End of Session Equipment Utilized During Treatment: Gait belt Activity Tolerance: Patient tolerated treatment well Patient left: in bed;with call bell/phone within reach;with bed alarm set;with family/visitor present Nurse Communication: Mobility status PT Visit Diagnosis: Ataxic gait (R26.0);Unsteadiness on feet (R26.81);Hemiplegia and hemiparesis;Dizziness and giddiness (R42) Hemiplegia - Right/Left: Left Hemiplegia - dominant/non-dominant: Non-dominant Hemiplegia - caused by: Cerebral infarction    Time: 1430-1453 PT Time Calculation (min) (ACUTE ONLY): 23 min   Charges:   PT Evaluation $PT Eval Moderate Complexity: 1 Mod          Marisa Severin, PT, DPT Acute Rehabilitation Services Pager [phone] Office [phone]     Marguarite Arbour A Sabra Heck 07/13/2021, 3:55 PM

## 2021-07-13 NOTE — Evaluation (Addendum)
Occupational Therapy Evaluation Patient Details Name: Krista Joseph MRN: [id number] DOB: Dec 20, 1929 Today's Date: 07/13/2021   History of Present Illness 88 y/o female admitted on 07/12/20 with N/V and dizziness. Head CT-remote stroke left cerebellum. Brain MRI- Relatively large acute infarct right PICA territory. PMH includes CAD, HTN.   Clinical Impression   PTA, pt was living at home and family very supportive reporting someone is always with her. Performs her ADLs, IADLs, and enjoys gardening. Pt currently requiring Min A for LB ADLs, Min Guard-Min A for standing ADLs, and Min-Mod A for functional mobility. Pt presenting with decreased coordination, cognition, and balance impacting her safe performance of ADLs.  Pt would benefit from further acute OT to facilitate safe dc. Recommend dc to [facility] for further OT to optimize safety, independence with ADLs, and return to PLOF.      Recommendations for follow up therapy are one component of a multi-disciplinary discharge planning process, led by the attending physician.  Recommendations may be updated based on patient status, additional functional criteria and insurance authorization.   Follow Up Recommendations  Acute inpatient rehab (3hours/day)    Assistance Recommended at Discharge Frequent or constant Supervision/Assistance  Patient can return home with the following A little help with walking and/or transfers;A little help with bathing/dressing/bathroom;Assistance with cooking/housework;Direct supervision/assist for medications management    Functional Status Assessment     Equipment Recommendations  None recommended by OT    Recommendations for Other Services Rehab consult;PT consult     Precautions / Restrictions Precautions Precautions: Fall Restrictions Weight Bearing Restrictions: No      Mobility Bed Mobility Overal bed mobility: Needs Assistance Bed Mobility: Supine to Sit     Supine to sit: Supervision, HOB  elevated     General bed mobility comments: Use of rail, supervision for safety.    Transfers Overall transfer level: Needs assistance Equipment used: 1 person hand held assist Transfers: Sit to/from Stand Sit to Stand: Min assist           General transfer comment: Min A to steady in standing/leaning toward left.      Balance Overall balance assessment: Needs assistance Sitting-balance support: Feet supported, No upper extremity supported Sitting balance-Leahy Scale: Fair     Standing balance support: During functional activity Standing balance-Leahy Scale: Fair Standing balance comment: Requires external support in standing for walking, able to brush teeth at sink wtih close min guard.                           ADL either performed or assessed with clinical judgement   ADL Overall ADL's : Needs assistance/impaired Eating/Feeding: Set up;Sitting   Grooming: Oral care;Min guard;Minimal assistance;Standing Grooming Details (indicate cue type and reason): Min Guard-Min A for balance while standing at sink for oral care. Upper Body Bathing: Min guard;Sitting   Lower Body Bathing: Minimal assistance;Sit to/from stand   Upper Body Dressing : Min guard;Sitting   Lower Body Dressing: Minimal assistance;Sit to/from stand Lower Body Dressing Details (indicate cue type and reason): Pt donning depends with Min A for standing balance Toilet Transfer: Minimal assistance;Moderate assistance;Ambulation   Toileting- Clothing Manipulation and Hygiene: Maximal assistance;+2 for safety/equipment;Sit to/from stand Toileting - Clothing Manipulation Details (indicate cue type and reason): Max A for peri care after BM in depends. Pt without awareness she had had a BM     Functional mobility during ADLs: Minimal assistance;Moderate assistance General ADL Comments: Pt presenting with decreased coodination and balance  Vision         Perception     Praxis       Pertinent Vitals/Pain Pain Assessment Pain Assessment: No/denies pain     Hand Dominance Right   Extremity/Trunk Assessment Upper Extremity Assessment Upper Extremity Assessment: RUE deficits/detail;LUE deficits/detail RUE Deficits / Details: WFL strength. Poor coordination as seen during finger opposition and alternating UE movement. RUE Coordination: decreased fine motor;decreased gross motor LUE Deficits / Details: WFL strength. Poor coordination as seen during finger opposition and alternating UE movement. LUE Coordination: decreased fine motor;decreased gross motor   Lower Extremity Assessment Lower Extremity Assessment: Defer to PT evaluation RLE Deficits / Details: Grossly ~5/5 throughout RLE Coordination: decreased fine motor;decreased gross motor LLE Deficits / Details: Giveway noted with MMT for knee extension, decreased AROM hip flexion grossly ~2+/5. ankle 5/5. LLE Sensation: WNL LLE Coordination: decreased fine motor;decreased gross motor       Communication Communication Communication: No difficulties   Cognition Arousal/Alertness: Awake/alert Behavior During Therapy: WFL for tasks assessed/performed Overall Cognitive Status: Impaired/Different from baseline Area of Impairment: Orientation, Attention, Memory, Safety/judgement, Following commands, Awareness, Problem solving                 Orientation Level: Disoriented to, Situation, Time Current Attention Level: Sustained, Selective Memory: Decreased short-term memory Following Commands: Follows one step commands with increased time Safety/Judgement: Decreased awareness of safety, Decreased awareness of deficits Awareness: Emergent Problem Solving: Slow processing, Requires verbal cues General Comments: Family reporting they have noticed some dementia like behavior for the last year. No awareness of balance deficits.     General Comments  Daughter and granddaughter present during session.     Exercises     Shoulder Instructions      Home Living Family/patient expects to be discharged to:: Private residence Living Arrangements: Other relatives (grandchildren) Available Help at Discharge: Family;Available 24 hours/day Type of Home: House Home Access: Stairs to enter [employer] of Steps: 1 flight + 5 Entrance Stairs-Rails: Right;Left Home Layout: One level     Bathroom Shower/Tub: Occupational psychologist: Standard     Home Equipment: Shower seat   Additional Comments: per family, pt has been showing signs of dementia for the last year affecting her memory.      Prior Functioning/Environment Prior Level of Function : Independent/Modified Independent             Mobility Comments: no driving, independent, cooks/cleans, works in garden, no falls last 6 months ADLs Comments: independent        OT Problem List:        OT Treatment/Interventions:      OT Goals(Current goals can be found in the care plan section)    OT Frequency: Min 2X/week    Co-evaluation PT/OT/SLP Co-Evaluation/Treatment: Yes Reason for Co-Treatment: For patient/therapist safety;To address functional/ADL transfers PT goals addressed during session: Mobility/safety with mobility;Balance OT goals addressed during session: ADL's and self-care      AM-PAC OT "6 Clicks" Daily Activity     Outcome Measure Help from another person eating meals?: None Help from another person taking care of personal grooming?: A Little Help from another person toileting, which includes using toliet, bedpan, or urinal?: A Lot Help from another person bathing (including washing, rinsing, drying)?: A Lot Help from another person to put on and taking off regular upper body clothing?: A Little Help from another person to put on and taking off regular lower body clothing?: A Little 6 Click Score: 17  End of Session Equipment Utilized During Treatment: Gait belt Nurse Communication:  Mobility status  Activity Tolerance: Patient tolerated treatment well Patient left: in bed;with call bell/phone within reach;with bed alarm set  OT Visit Diagnosis: Unsteadiness on feet (R26.81);Other abnormalities of gait and mobility (R26.89);Muscle weakness (generalized) (M62.81)                Time: 2633-3545 OT Time Calculation (min): 19 min Charges:  OT General Charges $OT Visit: 1 Visit OT Evaluation $OT Eval Moderate Complexity: 1 Mod  Ajani Schnieders MSOT, OTR/L Acute Rehab Office: [place] 07/13/2021, 5:11 PM

## 2021-07-13 NOTE — Assessment & Plan Note (Addendum)
Patient has a history of paroxysmal atrial fibrillation on chronic anticoagulation of Eliquis.  She appears to be rate controlled at this time. -Orders were initially placed to continue Eliquis, but after imaging noted size of a large stroke, anticoagulation initially delayed.  Due to possible failure in therapy, Xarelto started in place of.

## 2021-07-13 NOTE — Assessment & Plan Note (Addendum)
Initially on Lipitor 20 at home.  LDL at 137 and Lipitor increased to 80.

## 2021-07-13 NOTE — Progress Notes (Signed)
? ?  Inpatient Rehab Admissions Coordinator : ? ?Per therapy recommendations, patient was screened for CIR candidacy by Shunte Senseney RN MSN.  At this time patient appears to be a potential candidate for CIR. I will place a rehab consult per protocol for full assessment. Please call me with any questions. ? ?Lavilla Delamora RN MSN ?Admissions Coordinator ?[phone] ?  ?

## 2021-07-13 NOTE — ED Notes (Signed)
Report called to CN at [facility] ED

## 2021-07-13 NOTE — ED Notes (Signed)
Given Ensure, declined breakfast items on hand

## 2021-07-13 NOTE — ED Notes (Signed)
Report given to Izora Gala , receiving nurse for [facility]

## 2021-07-13 NOTE — Assessment & Plan Note (Addendum)
Initially diagnosed with high-grade invasive urothelial carcinoma in 2018.  Patient underwent TURBT, but repeat biopsies in 2019 showed return of cancer.  She is felt to be a surgical candidate at that time and underwent treatment with Pembro every 3 weeks started in 10/2017 with palliative intent.  Patient had did well until progression of the mass into her pelvis for which palliative radiation of the pelvic mass was performed mid 2020.  Patient has been off therapy since that time. -Continue outpatient follow-up with hematology oncology to [facility]

## 2021-07-13 NOTE — Assessment & Plan Note (Addendum)
Home blood pressure regimen includes amlodipine 5 mg daily and furosemide 20 mg every other day. -Allowing for permissive hypertension at this time, although not much because of large size of CVA. -Resume home blood pressure regimen upon discharge

## 2021-07-14 ENCOUNTER — Inpatient Hospital Stay (HOSPITAL_COMMUNITY): Payer: Medicare HMO

## 2021-07-14 DIAGNOSIS — I6389 Other cerebral infarction: Secondary | ICD-10-CM | POA: Diagnosis not present

## 2021-07-14 DIAGNOSIS — I639 Cerebral infarction, unspecified: Secondary | ICD-10-CM | POA: Diagnosis not present

## 2021-07-14 DIAGNOSIS — E7800 Pure hypercholesterolemia, unspecified: Secondary | ICD-10-CM | POA: Diagnosis not present

## 2021-07-14 DIAGNOSIS — K219 Gastro-esophageal reflux disease without esophagitis: Secondary | ICD-10-CM | POA: Diagnosis present

## 2021-07-14 DIAGNOSIS — F0390 Unspecified dementia without behavioral disturbance: Secondary | ICD-10-CM | POA: Diagnosis present

## 2021-07-14 DIAGNOSIS — I10 Essential (primary) hypertension: Secondary | ICD-10-CM | POA: Diagnosis not present

## 2021-07-14 DIAGNOSIS — I480 Paroxysmal atrial fibrillation: Secondary | ICD-10-CM | POA: Diagnosis not present

## 2021-07-14 DIAGNOSIS — I63541 Cerebral infarction due to unspecified occlusion or stenosis of right cerebellar artery: Secondary | ICD-10-CM | POA: Diagnosis not present

## 2021-07-14 DIAGNOSIS — C679 Malignant neoplasm of bladder, unspecified: Secondary | ICD-10-CM | POA: Diagnosis not present

## 2021-07-14 DIAGNOSIS — F0392 Unspecified dementia, unspecified severity, with psychotic disturbance: Secondary | ICD-10-CM | POA: Diagnosis present

## 2021-07-14 LAB — HEMOGLOBIN A1C
Hgb A1c MFr Bld: 4.9 % (ref 4.8–5.6)
Mean Plasma Glucose: 94 mg/dL

## 2021-07-14 LAB — ECHOCARDIOGRAM COMPLETE
AR max vel: 2.68 cm2
AV Area VTI: 2.57 cm2
AV Area mean vel: 2.59 cm2
AV Mean grad: 5 mmHg
AV Peak grad: 8.9 mmHg
Ao pk vel: 1.49 m/s
Area-P 1/2: 1.94 cm2
Height: 61 in
S' Lateral: 2.5 cm
Weight: 1936 oz

## 2021-07-14 MED ORDER — ATORVASTATIN CALCIUM 80 MG PO TABS
80.0000 mg | ORAL_TABLET | Freq: Every day | ORAL | Status: DC
  Administered 2021-07-14 – 2021-07-17 (×4): 80 mg via ORAL
  Filled 2021-07-14 (×3): qty 1

## 2021-07-14 NOTE — Progress Notes (Signed)
°  Echocardiogram 2D Echocardiogram has been performed.  Marybelle Killings 07/14/2021, 2:14 PM

## 2021-07-14 NOTE — Progress Notes (Signed)
[facility] Progress Note  Patient: Krista Joseph    PZW:[id number]  DOA: 07/12/2021    Date of Service: the patient was seen and examined on 07/14/2021  Brief hospital course: 88 year old female with past medical history of hypertension, atrial fibrillation on anticoagulation neoplasm of the bladder status post TURP and GERD presented to the emergency room on 2/23 with complaints of sudden onset dizziness that had started earlier that afternoon.  CT scan noted chronic left cerebellar infarct, but MRI noted large acute right PICA stroke and patient was brought into the hospitalist service for further evaluation and treatment.  Neurology consulted.  Assessment and Plan: Assessment and Plan: * CVA (cerebral vascular accident) ([place])- (present on admission) MRI was significant for a large acute right PICA stroke.  Repeat CT notes no worsening.  Seen by PT who is recommending inpatient rehab, but patient declined and family wants home health.  Taking off of anticoagulation because of concerns for hemorrhagic transformation.  Awaiting echocardiogram results.  Malignant neoplasm of bladder ([place])- (present on admission) Initially diagnosed with high-grade invasive urothelial carcinoma in 2018.  Patient underwent TURBT, but repeat biopsies in 2019 showed return of cancer.  She is felt to be a surgical candidate at that time and underwent treatment with Pembro every 3 weeks started in 10/2017 with palliative intent.  Patient had did well until progression of the mass into her pelvis for which palliative radiation of the pelvic mass was performed mid 2020.  Patient has been off therapy since that time. -Continue outpatient follow-up with hematology oncology to [facility]  Paroxysmal atrial fibrillation [facility])- (present on admission) Patient has a history of paroxysmal atrial fibrillation on chronic anticoagulation of Eliquis.  She appears to be rate controlled at this time. -Orders were initially placed to  continue Eliquis, but after imaging noted size of a large stroke was held due to possible hemorrhagic conversion  Essential hypertension- (present on admission) Home blood pressure regimen includes amlodipine 5 mg daily and furosemide 20 mg every other day. -Allowing for permissive hypertension at this time, although not much because of large size of CVA. -Resume home blood pressure regimen when medically appropriate  Hyperlipidemia- (present on admission) Initially on Lipitor 20 at home.  LDL at 137 and Lipitor increased to 80.   Acid reflux- (present on admission) Continue PPI       Body mass index is 22.86 kg/m.        Consultants: Neurology  Procedures: Echocardiogram: Results pending  Antimicrobials: None  Code Status: Full code   Subjective: Patient with no complaints.  Denies any headache  Objective: Noted elevated blood pressure, stable Vitals:   07/14/21 0749 07/14/21 1152  BP: (!) 147/65 (!) 144/73  Pulse: 65 (!) 55  Resp: 18 20  Temp: 98.4 F (36.9 C) 98.8 F (37.1 C)  SpO2: 97% 99%    Intake/Output Summary (Last 24 hours) at 07/14/2021 1514 Last data filed at 07/14/2021 0845 Gross per 24 hour  Intake 120 ml  Output 650 ml  Net -530 ml   Filed Weights   07/12/21 1724  Weight: 54.9 kg   Body mass index is 22.86 kg/m.  Exam:  General: Alert and oriented x2, at baseline HEENT: Normocephalic and atraumatic, mucous membranes slightly dry Cardiovascular: Irregular rhythm, rate controlled Respiratory: Clear to auscultation bilaterally Abdomen: Soft, nontender, nondistended, positive bowel sounds Musculoskeletal: No clubbing or cyanosis or edema Skin: No skin breaks, tears or lesions Psychiatry: Appropriate, no evidence of psychoses Neurology: No focal deficits  Data  Reviewed: Noted lipid panel with LDL of 137  Disposition:  Status is: Inpatient Remains inpatient appropriate because: Continued stroke work-up and finalizing  disposition    Family Communication: Daughter at the bedside DVT Prophylaxis: SCDs     Author: Annita Brod ,MD 07/14/2021 3:14 PM  To reach On-call, see care teams to locate the attending and reach out via [email or web address]. Between 7PM-7AM, please contact night-coverage If you still have difficulty reaching the attending provider, please page the [facility] (Director on Call) for [facility] on amion for assistance.

## 2021-07-14 NOTE — Progress Notes (Addendum)
Physical Therapy Treatment Patient Details Name: Krista Joseph MRN: [id number] DOB: 03/18/1930 Today's Date: 07/14/2021   History of Present Illness 88 y/o female admitted on 07/12/20 with N/V and dizziness. Head CT-remote stroke left cerebellum. Brain MRI- Relatively large acute infarct right PICA territory. PMH includes CAD, HTN.    PT Comments    Pt has progressed toward goals with less ataxia, but still staggering R and generally unsteady during gait.  Emphasis on safe transition to EOB and sit to stands, with 2 trials of gait training to work on stability, coordination and overall safety.    Recommendations for follow up therapy are one component of a multi-disciplinary discharge planning process, led by the attending physician.  Recommendations may be updated based on patient status, additional functional criteria and insurance authorization.  Follow Up Recommendations  Acute inpatient rehab (3hours/day) (pt and daughter agree that pt would prefer and do better at home or at [facility] though she could benefit best at [facility])     Assistance Recommended at Discharge Frequent or constant Supervision/Assistance  Patient can return home with the following A little help with walking and/or transfers;A little help with bathing/dressing/bathroom;Assistance with cooking/housework;Direct supervision/assist for financial management;Assist for transportation;Help with stairs or ramp for entrance;Direct supervision/assist for medications management   Equipment Recommendations  Rolling walker (2 wheels);BSC/3in1    Recommendations for Other Services       Precautions / Restrictions Precautions Precautions: Fall Restrictions Weight Bearing Restrictions: No     Mobility  Bed Mobility Overal bed mobility: Needs Assistance Bed Mobility: Supine to Sit     Supine to sit: Supervision, HOB elevated     General bed mobility comments: no use of the rail,  pt able to boost up toward HOB in supine.     Transfers Overall transfer level: Needs assistance Equipment used: 1 person hand held assist Transfers: Sit to/from Stand Sit to Stand: Min assist           General transfer comment: stability assist    Ambulation/Gait Ambulation/Gait assistance: Min assist Gait Distance (Feet): 90 Feet (the 80 feet after rest and other activity.) Assistive device: 1 person hand held assist Gait Pattern/deviations: Step-through pattern, Staggering right, Drifts right/left Gait velocity: pt unable to control her speed and gets out of control exacerbating stagger to the right. Gait velocity interpretation: 1.31 - 2.62 ft/sec, indicative of limited community ambulator   General Gait Details: pt not safe to ambulate unassisted.  no evidence of ataxia, step through pattern, but frequent stagger R with degradation as she fatigtues after 30-40 feet.  variable step width from narrowed to increasing wider step width.   Stairs             Wheelchair Mobility    Modified Rankin (Stroke Patients Only) Modified Rankin (Stroke Patients Only) Pre-Morbid Rankin Score: Slight disability Modified Rankin: Moderately severe disability     Balance Overall balance assessment: Needs assistance   Sitting balance-Leahy Scale: Fair       Standing balance-Leahy Scale: Fair Standing balance comment: Requires external support for walking and dynamic tasks.                 Standardized Balance Assessment Standardized Balance Assessment : Berg Balance Test   Berg Balance Test Sit to Stand: Able to stand using hands after several tries Standing Unsupported: Able to stand 30 seconds unsupported Sitting with Back Unsupported but Feet Supported on Floor or Stool: Able to sit safely and securely 2 minutes Stand to Sit: Controls  descent by using hands Transfers: Able to transfer safely, definite need of hands Standing Unsupported with Eyes Closed: Able to stand 10 seconds with supervision Standing  Ubsupported with Feet Together: Able to place feet together independently but unable to hold for 30 seconds From Standing, Reach Forward with Outstretched Arm: Can reach forward >12 cm safely (5") From Standing Position, Pick up Object from Floor: Able to pick up shoe, needs supervision From Standing Position, Turn to Look Behind Over each Shoulder: Looks behind one side only/other side shows less weight shift Turn 360 Degrees: Able to turn 360 degrees safely but slowly Standing Unsupported, Alternately Place Feet on Step/Stool:  (NT) Standing Unsupported, One Foot in Front:  (NT) Standing on One Leg:  (NT)        Cognition Arousal/Alertness: Awake/alert Behavior During Therapy: WFL for tasks assessed/performed Overall Cognitive Status: Impaired/Different from baseline Area of Impairment: Orientation, Attention, Memory, Safety/judgement, Following commands, Awareness, Problem solving                 Orientation Level: Disoriented to, Situation, Time Current Attention Level: Sustained, Selective Memory: Decreased short-term memory Following Commands: Follows one step commands with increased time Safety/Judgement: Decreased awareness of safety, Decreased awareness of deficits Awareness: Emergent Problem Solving: Slow processing, Requires verbal cues General Comments: Family reporting they have noticed some dementia like behavior for the last year. Little awareness of balance deficits.        Exercises      General Comments General comments (skin integrity, edema, etc.): Daughter present for session      Pertinent Vitals/Pain Pain Assessment Pain Assessment: Faces Faces Pain Scale: No hurt Pain Intervention(s): Monitored during session    Home Living                          Prior Function            PT Goals (current goals can now be found in the care plan section) Acute Rehab PT Goals Patient Stated Goal: to go home PT Goal Formulation: With  patient Time For Goal Achievement: 07/27/21 Potential to Achieve Goals: Fair Progress towards PT goals: Progressing toward goals    Frequency    Min 4X/week      PT Plan Current plan remains appropriate    Co-evaluation              AM-PAC PT "6 Clicks" Mobility   Outcome Measure  Help needed turning from your back to your side while in a flat bed without using bedrails?: A Little Help needed moving from lying on your back to sitting on the side of a flat bed without using bedrails?: A Little Help needed moving to and from a bed to a chair (including a wheelchair)?: A Little Help needed standing up from a chair using your arms (e.g., wheelchair or bedside chair)?: A Little Help needed to walk in hospital room?: A Lot Help needed climbing 3-5 steps with a railing? : A Little 6 Click Score: 17    End of Session   Activity Tolerance: Patient tolerated treatment well;Patient limited by fatigue Patient left: in bed;with call bell/phone within reach;with bed alarm set;with family/visitor present Nurse Communication: Mobility status PT Visit Diagnosis: Unsteadiness on feet (R26.81);Other abnormalities of gait and mobility (R26.89);Difficulty in walking, not elsewhere classified (R26.2);Other symptoms and signs involving the nervous system (R29.898)     Time: 5638-7564 PT Time Calculation (min) (ACUTE ONLY): 27 min  Charges:  $Gait  Training: 8-22 mins $Therapeutic Activity: 8-22 mins               07/14/2021  Ginger Carne., PT Acute Rehabilitation Services [phone]  (pager) [phone]  (office)    Tessie Fass Joziah Dollins 07/14/2021, 12:25 PM

## 2021-07-14 NOTE — Assessment & Plan Note (Signed)
At baseline 

## 2021-07-14 NOTE — Progress Notes (Signed)
Inpatient Rehab Admissions: ° °Inpatient Rehab Consult received.  I met with patient and family at the bedside for rehabilitation assessment and to discuss goals and expectations of an inpatient rehab admission.  They would like pt to receive HH therapy and not CIR. TOC made aware. AC will sign off. ° °Signed: ° Graves Madden, MS, CCC-SLP °Admissions Coordinator °[phone] ° ° °

## 2021-07-14 NOTE — Progress Notes (Addendum)
STROKE TEAM PROGRESS NOTE   SUBJECTIVE (INTERVAL HISTORY) Her duaghter is at the bedside.  Overall her condition is gradually improving. Patient and daughter report that patient had some gait instability with L lean, nausea, and vomiting prompting this admission. Patient reports resolution of sx this morning.    CTH with no acute abnormalities. MRI with large acute infarct R PICA territory without hemorrhage.  OBJECTIVE CBC:  Recent Labs  Lab 07/12/21 1743  WBC 8.4  NEUTROABS 7.4  HGB 13.0  HCT 39.8  MCV 96.8  PLT 500   Basic Metabolic Panel:  Recent Labs  Lab 07/12/21 1743  NA 136  K 3.7  CL 104  CO2 23  GLUCOSE 141*  BUN 18  CREATININE 0.90  CALCIUM 9.3   Lipid Panel:  Recent Labs  Lab 07/13/21 1650  CHOL 214*  TRIG 28  HDL 71  CHOLHDL 3.0  VLDL 6  LDLCALC 137*   HgbA1c:  Recent Labs  Lab 07/13/21 1650  HGBA1C 4.9   Urine Drug Screen: No results for input(s): LABOPIA, COCAINSCRNUR, LABBENZ, AMPHETMU, THCU, LABBARB in the last 168 hours.  Alcohol Level No results for input(s): ETH in the last 168 hours.  IMAGING past 24 hours MR ANGIO HEAD WO CONTRAST  Result Date: 07/13/2021 CLINICAL DATA:  Acute infarct in the right parasellar territory on same-day MRI EXAM: MRA NECK WITHOUT CONTRAST MRA HEAD WITHOUT CONTRAST TECHNIQUE: Angiographic images of the Circle of Willis were acquired using MRA technique without intravenous contrast. COMPARISON:  MR head 07/13/2021, CT head 07/12/2021, no prior MRA FINDINGS: MRA NECK FINDINGS Standard aortic branching. No evidence of dissection or aneurysm. Common, internal, and external carotid arteries are patent, without hemodynamically significant stenosis; diminished flow in a segment of the left ICA (series 6, image 56) is favored to be artifactual and secondary to the direction of flow. Extracranial vertebral arteries are patent, without hemodynamically significant stenosis. MRA HEAD FINDINGS Both internal carotid arteries  are patent to the termini, without significant stenosis. A1 segments patent. Normal anterior communicating artery. Anterior cerebral arteries are patent to their distal aspects. No M1 stenosis or occlusion. Normal MCA bifurcations. Distal MCA branches perfused and symmetric. Vertebral arteries patent to the vertebrobasilar junction without stenosis. Posterior inferior cerebral arteries patent proximally, with areas of poor signal in the mid right PICA (series 2, image 37). Basilar patent to its distal aspect. Superior cerebellar arteries patent bilaterally. Patent P1 segments. PCAs perfused to their distal aspects without stenosis. The bilateral posterior communicating arteries are not visualized. IMPRESSION: 1. No large vessel occlusion. There are areas of poor signal within the mid right PICA, likely indicating moderate to severe stenosis. No other hemodynamically significant intracranial stenosis. 2. No hemodynamically significant stenosis in the neck. An area of apparent diminished flow in a segment of the left ICA is favored to be artifactual, secondary to the direction of flow. Electronically Signed   By: Merilyn Baba M.D.   On: 07/13/2021 23:37   MR ANGIO NECK WO CONTRAST  Result Date: 07/13/2021 CLINICAL DATA:  Acute infarct in the right parasellar territory on same-day MRI EXAM: MRA NECK WITHOUT CONTRAST MRA HEAD WITHOUT CONTRAST TECHNIQUE: Angiographic images of the Circle of Willis were acquired using MRA technique without intravenous contrast. COMPARISON:  MR head 07/13/2021, CT head 07/12/2021, no prior MRA FINDINGS: MRA NECK FINDINGS Standard aortic branching. No evidence of dissection or aneurysm. Common, internal, and external carotid arteries are patent, without hemodynamically significant stenosis; diminished flow in a segment of the left  ICA (series 6, image 56) is favored to be artifactual and secondary to the direction of flow. Extracranial vertebral arteries are patent, without  hemodynamically significant stenosis. MRA HEAD FINDINGS Both internal carotid arteries are patent to the termini, without significant stenosis. A1 segments patent. Normal anterior communicating artery. Anterior cerebral arteries are patent to their distal aspects. No M1 stenosis or occlusion. Normal MCA bifurcations. Distal MCA branches perfused and symmetric. Vertebral arteries patent to the vertebrobasilar junction without stenosis. Posterior inferior cerebral arteries patent proximally, with areas of poor signal in the mid right PICA (series 2, image 37). Basilar patent to its distal aspect. Superior cerebellar arteries patent bilaterally. Patent P1 segments. PCAs perfused to their distal aspects without stenosis. The bilateral posterior communicating arteries are not visualized. IMPRESSION: 1. No large vessel occlusion. There are areas of poor signal within the mid right PICA, likely indicating moderate to severe stenosis. No other hemodynamically significant intracranial stenosis. 2. No hemodynamically significant stenosis in the neck. An area of apparent diminished flow in a segment of the left ICA is favored to be artifactual, secondary to the direction of flow. Electronically Signed   By: Merilyn Baba M.D.   On: 07/13/2021 23:37   MR BRAIN WO CONTRAST  Result Date: 07/13/2021 CLINICAL DATA:  Acute neuro deficit.  Central vertigo EXAM: MRI HEAD WITHOUT CONTRAST TECHNIQUE: Multiplanar, multiecho pulse sequences of the brain and surrounding structures were obtained without intravenous contrast. COMPARISON:  CT head 07/12/2021 FINDINGS: Brain: Acute infarct right PICA territory. This is relatively large area of infarction measuring 5 cm in diameter. No other acute infarct Chronic infarct left posterior cerebellum. Moderate chronic microvascular ischemic change in the white matter. Chronic infarct left thalamus. Moderate atrophy. Negative for hemorrhage or mass. Vascular: Normal arterial flow voids Skull and  upper cervical spine: No focal skeletal lesion. Sinuses/Orbits: Paranasal sinuses clear.  Left cataract extraction Other: Dermal lipoma posterior to the right mastoid sinus measuring 28 x 15 mm. IMPRESSION: Relatively large acute infarct right PICA territory without hemorrhage. Atrophy and chronic ischemic changes as described above. Electronically Signed   By: Franchot Gallo M.D.   On: 07/13/2021 14:14     PHYSICAL EXAM Temp:  [98.4 F (36.9 C)-99.2 F (37.3 C)] 98.4 F (36.9 C) (02/24 0749) Pulse Rate:  [47-65] 65 (02/24 0749) Resp:  [15-25] 18 (02/24 0749) BP: (131-155)/(51-65) 147/65 (02/24 0749) SpO2:  [96 %-100 %] 97 % (02/24 0749)  General - Well nourished, well developed, elderly woman in no apparent distress.  Cardiovascular - Regular rhythm and rate on telemetry. Although patient with known hx of Afib  Mental Status -  Level of arousal and orientation to place and person were intact, but not time. Language including naming and comprehension was assessed and found intact. Patient with some difficulties with repetition, mild expressive aphasia. Attention span and concentration were normal. Recent and remote memory were largely intact. Recall 2/3 words. Fund of Knowledge was assessed and was intact.  Cranial Nerves II - XII - II - Visual field intact OU. III, IV, VI - Extraocular movements intact. V - Facial sensation intact bilaterally. VII - Facial movement intact bilaterally. VIII - Hearing & vestibular intact bilaterally. X - Palate elevates symmetrically. XI - Chin turning & shoulder shrug intact bilaterally. XII - Tongue protrusion with mild L deviation.  Motor Strength - The patients strength was normal in all extremities and pronator drift was absent.  Bulk was normal and fasciculations were absent.   Motor Tone - Muscle tone  was assessed at the neck and appendages and was normal.  Sensory - Light touch was assessed and  symmetrical.    Coordination - The  patient had normal movements in the hands and feet with no ataxia or dysmetria.  Tremor was absent.  Gait and Station - deferred.    ASSESSMENT/PLAN Ms. Cierra Rothgeb is a 88 y.o. female with history of afib on eliquis, DVT, malignant neoplasm of bladder s/p TURPBT, arthritis, bronchitis, CAD, hypercholesterolemia, HLD and HTN presenting with dizziness and gait ataxia with L lean.   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 Afib failed eliquis Code Stroke CT head No acute abnormality. Chronic L cerebellar infarction/injury MRI  Large acute infarct R PICA territory without hemorrhage MRA  No large vessel occlusion, poor signal of mid-R PICA indicating mod to severe stenosis. No stenosis in neck. 2D Echo EF 60 to 65% LDL 137 HgbA1c 4.9 VTE prophylaxis - SCDs    Diet   Diet Heart Room service appropriate? Yes; Fluid consistency: Thin   aspirin 81 mg daily and Eliquis (apixaban) daily prior to admission, now on aspirin 325 mg daily. Will hold off on [facility] for 5-7 days due to size of infarct/ increased risk of bleeding. Therapy recommendations:  AIR Disposition:  Due to the size of patient's infarct, recommend that she be monitored over the weekend to ensure no hydrocephalus will develop.  Afib On Eliquis PTA Daughter confirmed patient compliant with medication On aspirin now, hold off Eliquis for now, consider resume next week 5 to 7 days post stroke to avoid hemorrhagic transformation.  Hypertension Home meds:  Amlodipine 5 mg BID, Lasix 20 mg qOD Stable Gradually normalize in 5-7 days Long-term BP goal normotensive  Hyperlipidemia Home meds:  Atorvastatin 20 mg (non-compliant) LDL 137, goal < 70 Will increase to 80 mg Continue statin at discharge  Other Stroke Risk Factors Advanced Age >/= 6  Former Cigarette smoker; quit 59 years ago Coronary artery disease  Other Active Problems Bladder cancer status post  TURBT  Hospital day # 1   Rosezetta Schlatter, MD Stroke Neurology- Neuro Psych Resident 07/14/2021 8:07 AM  ATTENDING NOTE: I reviewed above note and agree with the assessment and plan. Pt was seen and examined.   88 year old female with history of A-fib on Eliquis, DVT, bladder cancer, CAD, hypertension and hyperlipidemia admitted for dizziness, gait imbalance, ataxia leaning towards to the left.  CT showed right cerebellar large infarct.  MRI confirmed large right cerebellum infarct at right PICA territory.  MRA head and neck showed right PICA stenosis.  CT repeat showed no hydrocephalus.  EF 60 to 65%.  LDL 137 and A1c 4.9.  Creatinine 0.90.  On exam, patient awake alert, flat affect, orientated to self but not to age time.  Delayed recall 0/3.  Paucity of speech, follows some commands.  No aphasia.  No nystagmus or ataxia on exam.  No significant focal neuro deficit.  Etiology for patient stroke not quite clear, could be large vessel disease given PICA stenosis or A-fib failed Eliquis treatment.  Currently on aspirin 325, will hold off Eliquis for now until next week post stroke 5 to 7 days.  Increase statin to 80 mg.  Due to risk of hydrocephalus, recommend continue neuro monitoring over the weekend to ensure stable neuro status.  PT/OT recommend CIR, however family and patient would like home health.  For detailed assessment and plan, please refer to above as I have  made changes wherever appropriate.   Rosalin Hawking, MD PhD Stroke Neurology 07/14/2021 7:24 PM    To contact Stroke Continuity provider, please refer to [email or web address] After hours, contact General Neurology

## 2021-07-14 NOTE — Assessment & Plan Note (Signed)
Continue PPI ?

## 2021-07-14 NOTE — Hospital Course (Addendum)
88 year old female with past medical history of hypertension, atrial fibrillation on anticoagulation neoplasm of the bladder status post TURP and GERD presented to the emergency room on 2/23 with complaints of sudden onset dizziness that had started earlier that afternoon.  CT scan noted chronic left cerebellar infarct, but MRI noted large acute right PICA stroke and patient was brought into the hospitalist service for further evaluation and treatment.  Neurology consulted.  Patient remained stable.  Continued observation and delay in restarting full anticoagulation to ensure no hemorrhagic transformation.

## 2021-07-14 NOTE — TOC Initial Note (Signed)
Transition of Care [facility]) - Initial/Assessment Note    Patient Details  Name: Krista Joseph MRN: [id number] Date of Birth: 05-30-1929  Transition of [facility]) CM/SW Contact:    Pollie Friar, RN Phone Number: 07/14/2021, 1:52 PM  Clinical Narrative:                 Cm met with the patient and her daughter at the bedside. They prefer discharging home with home health over CIR. CM provided choice and and they had no preference. CM arranged [place] through [facility]. Information on the AVS.  [place] and 3 in 1 to be delivered to the room per [employer].  Per daughter patient will have 24 hour supervision/ assistance.  Pt has transport home when discharged.   Expected Discharge Plan: [place] Barriers to Discharge: Continued Medical Work up   Patient Goals and CMS Choice   CMS Medicare.gov Compare Post Acute Care list provided to:: Patient Represenative (must comment) Choice offered to / list presented to : Adult Children  Expected Discharge Plan and Services Expected Discharge Plan: [place]   Discharge Planning Services: CM Consult Post Acute Care Choice: Durable Medical Equipment, Home Health Living arrangements for the past 2 months: Single Family Home                 DME Arranged: 3-N-1, Walker rolling DME Agency: [employer] Date DME Agency Contacted: 07/14/21   Representative spoke with at DME Agency: Jasmin HH Arranged: PT, OT [place] Agency: [facility] Date [facility] Agency Contacted: 07/14/21   Representative spoke with at [place]: Delsa Sale  Prior Living Arrangements/Services Living arrangements for the past 2 months: [place] Lives with:: Adult Children Patient language and need for interpreter reviewed:: Yes Do you feel safe going back to the place where you live?: Yes      Need for Family Participation in Patient Care: Yes (Comment) Care giver support system in place?: Yes (comment)   Criminal Activity/Legal  Involvement Pertinent to Current Situation/Hospitalization: No - Comment as needed  Activities of Daily Living Home Assistive Devices/Equipment: None ADL Screening (condition at time of admission) Patient's cognitive ability adequate to safely complete daily activities?: Yes Is the patient deaf or have difficulty hearing?: No Does the patient have difficulty seeing, even when wearing glasses/contacts?: No Does the patient have difficulty concentrating, remembering, or making decisions?: No Patient able to express need for assistance with ADLs?: Yes Does the patient have difficulty dressing or bathing?: No Independently performs ADLs?: Yes (appropriate for developmental age) Does the patient have difficulty walking or climbing stairs?: No Weakness of Legs: Both Weakness of Arms/Hands: Both  Permission Sought/Granted                  Emotional Assessment Appearance:: Appears stated age         Psych Involvement: No (comment)  Admission diagnosis:  Dizziness [R42] Gait abnormality [R26.9] Stroke-like symptoms [R29.90] Unsteady gait when walking [R26.81] Nausea and vomiting, unspecified vomiting type [R11.2] CVA (cerebral vascular accident) [facility]) [I63.9] Patient Active Problem List   Diagnosis Date Noted   Acid reflux    Essential hypertension 07/13/2021   Hyperlipidemia 07/13/2021   Paroxysmal atrial fibrillation ([place]) 07/13/2021   Malignant neoplasm of bladder ([place]) 07/13/2021   CVA (cerebral vascular accident) ([place]) 07/12/2021   PCP:  Secundino Ginger, PA-C Pharmacy:   [id number] - [place], [place] - [address]  [place] [address] Phone: [phone] Fax: [place], [place] [address] Phone: [phone] Fax: [phone]     Social Determinants of Health (SDOH) Interventions    Readmission Risk  Interventions No flowsheet data found.

## 2021-07-15 DIAGNOSIS — I10 Essential (primary) hypertension: Secondary | ICD-10-CM | POA: Diagnosis not present

## 2021-07-15 DIAGNOSIS — F0392 Unspecified dementia, unspecified severity, with psychotic disturbance: Secondary | ICD-10-CM

## 2021-07-15 DIAGNOSIS — I63541 Cerebral infarction due to unspecified occlusion or stenosis of right cerebellar artery: Secondary | ICD-10-CM | POA: Diagnosis not present

## 2021-07-15 NOTE — Progress Notes (Addendum)
[facility] Progress Note  Patient: Krista Joseph    WTU:[id number]  DOA: 07/12/2021    Date of Service: the patient was seen and examined on 07/15/2021  Brief hospital course: 88 year old female with past medical history of hypertension, atrial fibrillation on anticoagulation neoplasm of the bladder status post TURP and GERD presented to the emergency room on 2/23 with complaints of sudden onset dizziness that had started earlier that afternoon.  CT scan noted chronic left cerebellar infarct, but MRI noted large acute right PICA stroke and patient was brought into the hospitalist service for further evaluation and treatment.  Neurology consulted.  Assessment and Plan: Assessment and Plan: * CVA (cerebral vascular accident) ([place])- (present on admission) MRI was significant for a large acute right PICA stroke.  Repeat CT notes no worsening.  Seen by PT who is recommending inpatient rehab, but patient declined and family wants home health.  Hold anticoagulation x1 week because of concerns for hemorrhagic transformation.  Echocardiogram unremarkable.  As per neurology original recommendations, monitor through the weekend given large size of stroke.  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  Patient underwent TURBT, but repeat biopsies in 2019 showed return of cancer.  She is felt to be a surgical candidate at that time and underwent treatment with Pembro every 3 weeks started in 10/2017 with palliative intent.  Patient had did well until progression of the mass into her pelvis for which palliative radiation of the pelvic mass was performed mid 2020.  Patient has been off therapy since that time. -Continue outpatient follow-up with hematology oncology to [facility]  Paroxysmal atrial fibrillation [facility])- (present on admission) Patient has a history of paroxysmal atrial fibrillation on chronic anticoagulation of  Eliquis.  She appears to be rate controlled at this time. -Orders were initially placed to continue Eliquis, but after imaging noted size of a large stroke was held due to possible hemorrhagic conversion  Essential hypertension- (present on admission) Home blood pressure regimen includes amlodipine 5 mg daily and furosemide 20 mg every other day. -Allowing for permissive hypertension at this time, although not much because of large size of CVA. -Resume home blood pressure regimen when medically appropriate  Senile dementia uncomp, without behavioral disturbance ([place])- (present on admission) At baseline.  Hyperlipidemia- (present on admission) Initially on Lipitor 20 at home.  LDL at 137 and Lipitor increased to 80.   Acid reflux- (present on admission) Continue PPI       Body mass index is 22.86 kg/m.        Consultants: Neurology  Procedures: Echocardiogram: Preserved ejection fraction with grade 1 diastolic dysfunction  Antimicrobials: None  Code Status: Full code   Subjective: Patient with no complaints.    Objective: Noted elevated blood pressure, stable Vitals:   07/15/21 1133 07/15/21 1528  BP: 136/60 (!) 153/53  Pulse: (!) 57 (!) 51  Resp: 20 18  Temp: 98.2 F (36.8 C) 98.4 F (36.9 C)  SpO2: 96% 97%    Intake/Output Summary (Last 24 hours) at 07/15/2021 1630 Last data filed at 07/15/2021 0416 Gross per 24 hour  Intake 120 ml  Output 250 ml  Net -130 ml   Filed Weights   07/12/21 1724  Weight: 54.9 kg   Body mass index is 22.86 kg/m.  Exam:  General: Alert and oriented x2, at baseline HEENT: Normocephalic and atraumatic, mucous membranes slightly dry Cardiovascular: Irregular rhythm, rate controlled Respiratory: Clear to auscultation bilaterally Abdomen: Soft,  nontender, nondistended, positive bowel sounds Musculoskeletal: No clubbing or cyanosis or edema Skin: No skin breaks, tears or lesions Psychiatry: Appropriate, no evidence of  psychoses Neurology: No focal deficits  Data Reviewed: Noted lipid panel with LDL of 137  Disposition:  Status is: Inpatient Remains inpatient appropriate because: Plan monitoring through the weekend due to large size of stroke.  Anticipate discharge on 2/26    Family Communication: Son at the bedside DVT Prophylaxis: SCDs     Author: Annita Brod ,MD 07/15/2021 4:30 PM  To reach On-call, see care teams to locate the attending and reach out via [email or web address]. Between 7PM-7AM, please contact night-coverage If you still have difficulty reaching the attending provider, please page the [facility] (Director on Call) for [facility] on amion for assistance.

## 2021-07-15 NOTE — Plan of Care (Signed)
Pt is alert oriented x 3. Pt denies pain. Pt was upset earlier stating she was ready to go home. Pts family present at bed side. Pt has been turned and repositioned. Pt has purewick in place for urine output. Pt has equal strength bilaterally. No distress noted.   Problem: Education: Goal: Knowledge of General Education information will improve Description: Including pain rating scale, medication(s)/side effects and non-pharmacologic comfort measures Outcome: Progressing   Problem: Health Behavior/Discharge Planning: Goal: Ability to manage health-related needs will improve Outcome: Progressing   Problem: Clinical Measurements: Goal: Ability to maintain clinical measurements within normal limits will improve Outcome: Progressing Goal: Will remain free from infection Outcome: Progressing Goal: Diagnostic test results will improve Outcome: Progressing Goal: Respiratory complications will improve Outcome: Progressing Goal: Cardiovascular complication will be avoided Outcome: Progressing   Problem: Activity: Goal: Risk for activity intolerance will decrease Outcome: Progressing   Problem: Nutrition: Goal: Adequate nutrition will be maintained Outcome: Progressing   Problem: Coping: Goal: Level of anxiety will decrease Outcome: Progressing   Problem: Elimination: Goal: Will not experience complications related to bowel motility Outcome: Progressing Goal: Will not experience complications related to urinary retention Outcome: Progressing   Problem: Pain Managment: Goal: General experience of comfort will improve Outcome: Progressing   Problem: Safety: Goal: Ability to remain free from injury will improve Outcome: Progressing   Problem: Skin Integrity: Goal: Risk for impaired skin integrity will decrease Outcome: Progressing

## 2021-07-15 NOTE — Progress Notes (Signed)
Physical Therapy Treatment Patient Details Name: Krista Joseph MRN: [id number] DOB: 10/03/1929 Today's Date: 07/15/2021   History of Present Illness 88 y/o female admitted on 07/12/20 with N/V and dizziness. Head CT-remote stroke left cerebellum. Brain MRI- Relatively large acute infarct right PICA territory. PMH includes CAD, HTN.    PT Comments    Pt is likely close to baseline mentation, she has made improvements in function.  Family would like any further therapies post d/c to be done from home as pt is most comfortable there.  Emphasis on bed mobility, sit to stand safety and safety/stability with use of the RW.  Therapy will be helpful to work pt away from the [place] as appropriate as pt does not like it.    Recommendations for follow up therapy are one component of a multi-disciplinary discharge planning process, led by the attending physician.  Recommendations may be updated based on patient status, additional functional criteria and insurance authorization.  Follow Up Recommendations  Home health PT     Assistance Recommended at Discharge Intermittent Supervision/Assistance  Patient can return home with the following A little help with walking and/or transfers;A little help with bathing/dressing/bathroom;Assistance with cooking/housework;Direct supervision/assist for financial management;Assist for transportation;Help with stairs or ramp for entrance;Direct supervision/assist for medications management   Equipment Recommendations  Other (comment) (equipment received)    Recommendations for Other Services       Precautions / Restrictions Precautions Precautions: Fall     Mobility  Bed Mobility Overal bed mobility: Needs Assistance Bed Mobility: Supine to Sit, Sit to Supine     Supine to sit: Supervision, HOB elevated Sit to supine: Supervision, HOB elevated   General bed mobility comments: pt also able to boost herself up toward [facility] without assist     Transfers Overall transfer level: Needs assistance   Transfers: Sit to/from Stand Sit to Stand: Min guard                Ambulation/Gait Ambulation/Gait assistance: Min guard, Min assist Gait Distance (Feet): 200 Feet Assistive device: Rolling walker (2 wheels) Gait Pattern/deviations: Step-through pattern, Decreased stride length   Gait velocity interpretation: 1.31 - 2.62 ft/sec, indicative of limited community ambulator   General Gait Details: generally steady with RW though unable to keep the device going straight and drifts to the left.  Initially appeared unaware, but as therapy called attention to it, pt started correcting the deviation each time.   Stairs             Wheelchair Mobility    Modified Rankin (Stroke Patients Only) Modified Rankin (Stroke Patients Only) Pre-Morbid Rankin Score: Slight disability Modified Rankin: Moderate disability     Balance Overall balance assessment: Needs assistance Sitting-balance support: Feet supported, No upper extremity supported Sitting balance-Leahy Scale: Fair     Standing balance support: During functional activity Standing balance-Leahy Scale: Fair                              Cognition Arousal/Alertness: Awake/alert Behavior During Therapy: WFL for tasks assessed/performed Overall Cognitive Status: History of cognitive impairments - at baseline                                 General Comments: Family reporting they have noticed some dementia like behavior for the last year. Little awareness of balance deficits.        Exercises  General Comments General comments (skin integrity, edema, etc.): son present      Pertinent Vitals/Pain Pain Assessment Pain Assessment: Faces Faces Pain Scale: No hurt Pain Intervention(s): Monitored during session    Home Living                          Prior Function            PT Goals (current goals can now be  found in the care plan section) Acute Rehab PT Goals Patient Stated Goal: to go home PT Goal Formulation: With patient Time For Goal Achievement: 07/27/21 Potential to Achieve Goals: Fair Progress towards PT goals: Progressing toward goals    Frequency           PT Plan Current plan remains appropriate    Co-evaluation              AM-PAC PT "6 Clicks" Mobility   Outcome Measure  Help needed turning from your back to your side while in a flat bed without using bedrails?: A Little Help needed moving from lying on your back to sitting on the side of a flat bed without using bedrails?: A Little Help needed moving to and from a bed to a chair (including a wheelchair)?: A Little Help needed standing up from a chair using your arms (e.g., wheelchair or bedside chair)?: A Little Help needed to walk in hospital room?: A Lot Help needed climbing 3-5 steps with a railing? : A Little 6 Click Score: 17    End of Session   Activity Tolerance: Patient tolerated treatment well Patient left: in bed;with call bell/phone within reach;with bed alarm set;with family/visitor present Nurse Communication: Mobility status PT Visit Diagnosis: Other abnormalities of gait and mobility (R26.89);Other symptoms and signs involving the nervous system (N00.370)     Time: 4888-9169 PT Time Calculation (min) (ACUTE ONLY): 15 min  Charges:  $Gait Training: 8-22 mins                     07/15/2021  Krista Joseph., PT Acute Rehabilitation Services [phone]  (pager) [phone]  (office)   [name] 07/15/2021, 12:24 PM

## 2021-07-15 NOTE — Progress Notes (Addendum)
STROKE TEAM PROGRESS NOTE   SUBJECTIVE (INTERVAL HISTORY) Her duaghter is at the bedside.  Overall her condition is gradually improving. Patient continues to report resolution of sx this morning.   CTH with no acute abnormalities. MRI with large acute infarct R PICA territory without hemorrhage. Follow-up [facility] 2/24 with cytotoxic edema in the right inferior cerebellum in the area of previously noted right PICA territory infarct, with some petechial hemorrhage but no hemorrhagic conversion. Mass effect on the fourth ventricle and 4 mm of right-to-left midline shift in the posterior fossa.  OBJECTIVE CBC:  Recent Labs  Lab 07/12/21 1743  WBC 8.4  NEUTROABS 7.4  HGB 13.0  HCT 39.8  MCV 96.8  PLT 614    Basic Metabolic Panel:  Recent Labs  Lab 07/12/21 1743  NA 136  K 3.7  CL 104  CO2 23  GLUCOSE 141*  BUN 18  CREATININE 0.90  CALCIUM 9.3    Lipid Panel:  Recent Labs  Lab 07/13/21 1650  CHOL 214*  TRIG 28  HDL 71  CHOLHDL 3.0  VLDL 6  LDLCALC 137*    HgbA1c:  Recent Labs  Lab 07/13/21 1650  HGBA1C 4.9    Urine Drug Screen: No results for input(s): LABOPIA, COCAINSCRNUR, LABBENZ, AMPHETMU, THCU, LABBARB in the last 168 hours.  Alcohol Level No results for input(s): ETH in the last 168 hours.  IMAGING past 24 hours CT HEAD WO CONTRAST (5MM)  Result Date: 07/14/2021 CLINICAL DATA:  Stroke, follow-up EXAM: CT HEAD WITHOUT CONTRAST TECHNIQUE: Contiguous axial images were obtained from the base of the skull through the vertex without intravenous contrast. RADIATION DOSE REDUCTION: This exam was performed according to the departmental dose-optimization program which includes automated exposure control, adjustment of the mA and/or kV according to patient size and/or use of iterative reconstruction technique. COMPARISON:  CT head 07/12/2021, correlation is also made with MRI head 07/13/2021. FINDINGS: Brain: Increased hypodensity in the right cerebellar hemisphere,  consistent with the acute PICA territory infarct seen on the prior MRI. Areas of curvilinear increased density in this area likely represent petechial hemorrhage. Cytotoxic edema causes effacement of the cerebellar sulci and fourth ventricle, with approximately 4 mm of right-to-left midline shift in the cerebellum. The basilar cisterns, prepontine cistern, and pontomedullary cisterns are preserved, with no evidence of tonsillar herniation through the foramen magnum. Unchanged size and configuration of the lateral and third ventricles. No extra-axial collection. No new infarct, significant parenchymal hemorrhage, or cerebral midline shift. Vascular: No hyperdense vessel. Atherosclerotic calcifications in the intracranial carotid and vertebral arteries. Skull: Normal. Negative for fracture or focal lesion. Sinuses/Orbits: No acute finding. Status post left lens replacement. Other: The mastoids are well aerated. IMPRESSION: Cytotoxic edema in the right inferior cerebellum in the area of previously noted right PICA territory infarct, with some petechial hemorrhage but no hemorrhagic conversion. Mass effect on the fourth ventricle and 4 mm of right-to-left midline shift in the posterior fossa. No evidence of hydrocephalus, cerebral midline shift, or tonsillar herniation. Electronically Signed   By: Merilyn Baba M.D.   On: 07/14/2021 19:15   ECHOCARDIOGRAM COMPLETE  Result Date: 07/14/2021    ECHOCARDIOGRAM REPORT   Patient Name:   ULDINE FUSTER Date of Exam: 07/14/2021 Medical Rec #:  [id number]       Height:       61.0 in Accession #:    [id number]      Weight:       121.0 lb Date of Birth:  August 09, 1929  BSA:          1.526 m Patient Age:    88 years        BP:           144/73 mmHg Patient Gender: F               HR:           58 bpm. Exam Location:  Inpatient Procedure: 2D Echo, Cardiac Doppler and Color Doppler Indications:    Stroke  History:        Patient has no prior history of Echocardiogram  examinations.                 CAD, Arrythmias:Atrial Fibrillation; Risk Factors:Dyslipidemia                 and Hypertension. Hx DVT.  Sonographer:    Clayton Lefort RDCS (AE) Referring Phys: [id number] RONDELL A SMITH IMPRESSIONS  1. Left ventricular ejection fraction, by estimation, is 60 to 65%. The left ventricle has normal function. The left ventricle has no regional wall motion abnormalities. There is moderate left ventricular hypertrophy. Left ventricular diastolic parameters are consistent with Grade I diastolic dysfunction (impaired relaxation).  2. Right ventricular systolic function is normal. The right ventricular size is normal.  3. Left atrial size was moderately dilated.  4. The mitral valve is normal in structure. No evidence of mitral valve regurgitation. No evidence of mitral stenosis.  5. The aortic valve is tricuspid. There is mild calcification of the aortic valve. Aortic valve regurgitation is mild. Aortic valve sclerosis/calcification is present, without any evidence of aortic stenosis.  6. The inferior vena cava is normal in size with greater than 50% respiratory variability, suggesting right atrial pressure of 3 mmHg. FINDINGS  Left Ventricle: Left ventricular ejection fraction, by estimation, is 60 to 65%. The left ventricle has normal function. The left ventricle has no regional wall motion abnormalities. The left ventricular internal cavity size was normal in size. There is  moderate left ventricular hypertrophy. Left ventricular diastolic parameters are consistent with Grade I diastolic dysfunction (impaired relaxation). Right Ventricle: The right ventricular size is normal. No increase in right ventricular wall thickness. Right ventricular systolic function is normal. Left Atrium: Left atrial size was moderately dilated. Right Atrium: Right atrial size was normal in size. Pericardium: There is no evidence of pericardial effusion. Mitral Valve: The mitral valve is normal in structure. No  evidence of mitral valve regurgitation. No evidence of mitral valve stenosis. Tricuspid Valve: The tricuspid valve is normal in structure. Tricuspid valve regurgitation is trivial. No evidence of tricuspid stenosis. Aortic Valve: The aortic valve is tricuspid. There is mild calcification of the aortic valve. Aortic valve regurgitation is mild. Aortic valve sclerosis/calcification is present, without any evidence of aortic stenosis. Aortic valve mean gradient measures 5.0 mmHg. Aortic valve peak gradient measures 8.9 mmHg. Aortic valve area, by VTI measures 2.57 cm. Pulmonic Valve: The pulmonic valve was normal in structure. Pulmonic valve regurgitation is not visualized. No evidence of pulmonic stenosis. Aorta: The aortic root is normal in size and structure. Venous: The inferior vena cava is normal in size with greater than 50% respiratory variability, suggesting right atrial pressure of 3 mmHg. IAS/Shunts: No atrial level shunt detected by color flow Doppler.  LEFT VENTRICLE PLAX 2D LVIDd:         3.80 cm   Diastology LVIDs:         2.50 cm   LV e' medial:  6.00 cm/s LV PW:         1.50 cm   LV E/e' medial:  11.0 LV IVS:        1.60 cm   LV e' lateral:   10.20 cm/s LVOT diam:     1.90 cm   LV E/e' lateral: 6.4 LV SV:         90 LV SV Index:   59 LVOT Area:     2.84 cm  RIGHT VENTRICLE RV Basal diam:  2.80 cm RV S prime:     12.60 cm/s TAPSE (M-mode): 1.8 cm LEFT ATRIUM             Index        RIGHT ATRIUM           Index LA diam:        2.40 cm 1.57 cm/m   RA Area:     11.00 cm LA Vol (A2C):   46.3 ml 30.35 ml/m  RA Volume:   21.70 ml  14.22 ml/m LA Vol (A4C):   50.5 ml 33.10 ml/m LA Biplane Vol: 49.1 ml 32.19 ml/m  AORTIC VALVE AV Area (Vmax):    2.68 cm AV Area (Vmean):   2.59 cm AV Area (VTI):     2.57 cm AV Vmax:           149.00 cm/s AV Vmean:          99.800 cm/s AV VTI:            0.350 m AV Peak Grad:      8.9 mmHg AV Mean Grad:      5.0 mmHg LVOT Vmax:         141.00 cm/s LVOT Vmean:         91.100 cm/s LVOT VTI:          0.317 m LVOT/AV VTI ratio: 0.91  AORTA Ao Root diam: 3.00 cm Ao Asc diam:  3.10 cm MITRAL VALVE MV Area (PHT): 1.94 cm    SHUNTS MV Decel Time: 392 msec    Systemic VTI:  0.32 m MV E velocity: 65.70 cm/s  Systemic Diam: 1.90 cm MV A velocity: 83.70 cm/s MV E/A ratio:  0.78 Glori Bickers MD Electronically signed by Glori Bickers MD Signature Date/Time: 07/14/2021/4:07:36 PM    Final      PHYSICAL EXAM Temp:  [98.2 F (36.8 C)-98.9 F (37.2 C)] 98.5 F (36.9 C) (02/25 0805) Pulse Rate:  [53-63] 58 (02/25 0805) Resp:  [16-20] 18 (02/25 0805) BP: (134-155)/(53-73) 141/63 (02/25 0805) SpO2:  [96 %-99 %] 96 % (02/25 0805)  General - Well nourished, well developed, elderly woman in no apparent distress.  Cardiovascular - L JVD. Regular rhythm and rate on telemetry. Although patient with known hx of Afib  Mental Status -  Level of arousal and orientation to place and person were intact, but not time. Language including naming and comprehension was assessed and found intact.   Cranial Nerves II - XII - II - Visual field intact OU. III, IV, VI - Extraocular movements intact. V - Facial sensation intact bilaterally. VII - Facial movement intact bilaterally. VIII - Hearing & vestibular intact bilaterally. X - Palate elevates symmetrically. XI - Chin turning & shoulder shrug intact bilaterally. XII - Tongue protrusion with mild L deviation.  Motor Strength - The patients strength was normal in all extremities and pronator drift was absent.  Bulk was normal and fasciculations were absent.   Motor Tone -  Muscle tone was assessed at the neck and appendages and was normal.  Sensory - Light touch was assessed and symmetrical.    Coordination - The patient had normal movements in the hands and feet with no ataxia or dysmetria.  Tremor was absent.  Gait and Station - deferred.    ASSESSMENT/PLAN Ms. Ivyrose Hashman is a 88 y.o. female with history of afib  on eliquis, DVT, malignant neoplasm of bladder s/p TURPBT, arthritis, bronchitis, CAD, hypercholesterolemia, HLD and HTN presenting with dizziness and gait ataxia with L lean.   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 Afib failed eliquis Code Stroke CT head No acute abnormality. Chronic L cerebellar infarction/injury MRI  Large acute infarct R PICA territory without hemorrhage MRA  No large vessel occlusion, poor signal of mid-R PICA indicating mod to severe stenosis. No stenosis in neck. Follow-up [facility] 2/24 cytotoxic edema in the right inferior cerebellum in the area of previously noted right PICA territory infarct, with some petechial hemorrhage but no hemorrhagic conversion. Mass effect on the fourth ventricle and 4 mm of right-to-left midline shift in the posterior fossa. 2D Echo EF 60 to 65% LDL 137 HgbA1c 4.9 VTE prophylaxis - SCDs    Diet   Diet Heart Room service appropriate? Yes; Fluid consistency: Thin   aspirin 81 mg daily and Eliquis (apixaban) daily prior to admission, now on aspirin 325 mg daily. Will hold off on [facility] for 5-7 days due to size of infarct/ increased risk of bleeding. Therapy recommendations:  AIR Disposition:  Due to the size of patient's infarct, recommend that she be monitored over the weekend to ensure no hydrocephalus will develop.  Afib On Eliquis PTA Daughter confirmed patient compliant with medication On aspirin now, hold off Eliquis for now, consider resume next week 5 to 7 days post stroke to avoid hemorrhagic transformation.  Hypertension Home meds:  Amlodipine 5 mg BID, Lasix 20 mg qOD Stable Gradually normalize in 5-7 days Long-term BP goal normotensive  Hyperlipidemia Home meds:  Atorvastatin 20 mg (non-compliant) LDL 137, goal < 70 Will increase to 80 mg Continue statin at discharge  Other Stroke Risk Factors Advanced Age >/= 57  Former Cigarette smoker; quit 59 years  ago Coronary artery disease  Other Active Problems Bladder cancer status post TURBT  Hospital day # 2   Rosezetta Schlatter, MD Stroke Neurology- Neuro Psych Resident 07/15/2021 8:11 AM  ATTENDING ATTESTATION:  Pt with right cerebellar ischemic stroke while on eliquis. Exam is stable. She is ready to go home. Updated pt and daughter at bedside on her dx. Transition to xeralto due to eliquis failure.   Dr. Reeves Forth evaluated pt independently, reviewed imaging, chart, labs. Discussed and formulated plan with the APP. Please see APP note above for details.   Total 36 minutes spent on counseling patient and coordinating care, writing notes and reviewing chart.   Shervin Cypert,MD    To contact Stroke Continuity provider, please refer to [email or web address] After hours, contact General Neurology

## 2021-07-15 NOTE — Progress Notes (Signed)
Occupational Therapy Treatment Patient Details Name: Krista Joseph MRN: [id number] DOB: 1930-05-02 Today's Date: 07/15/2021   History of present illness 88 y/o female admitted on 07/12/20 with N/V and dizziness. Head CT-remote stroke left cerebellum. Brain MRI- Relatively large acute infarct right PICA territory. PMH includes CAD, HTN.   OT comments  Pt progressing well with mobility and coordination of  BUEs. Pt continues to show deficits cognitively with safety, awareness, memory and problem solving. Pt perseverating on lunch time and food arrival time and then this therapist's earrings. Pt unable to recall date with cues immediately and after 3 minutes. Pt following simple commands, but poor insight into safety awareness- premature sitting on bed led to plopping down. Pt ambulatory in room and a short distance in hallway with RW and minguardA. Pt minA overall for ADL tasks. Education and practice provided for safe transfers with RW. Pt able to manipulate comb in hair, but assist required for fine motor tasks for opening/closing toothpaste lid. Pt minguardA for ADL at sink. Son in room reports that family will assist as needed. Pt would benefit from continued OT skilled services.    Recommendations for follow up therapy are one component of a multi-disciplinary discharge planning process, led by the attending physician.  Recommendations may be updated based on patient status, additional functional criteria and insurance authorization.    Follow Up Recommendations  Home health OT    Assistance Recommended at Discharge Frequent or constant Supervision/Assistance  Patient can return home with the following  A little help with walking and/or transfers;A little help with bathing/dressing/bathroom;Assistance with cooking/housework;Assist for transportation   Equipment Recommendations  None recommended by OT    Recommendations for Other Services      Precautions / Restrictions  Precautions Precautions: Fall Restrictions Weight Bearing Restrictions: No       Mobility Bed Mobility Overal bed mobility: Needs Assistance Bed Mobility: Supine to Sit, Sit to Supine     Supine to sit: Supervision, HOB elevated Sit to supine: Min assist   General bed mobility comments: pt plopping down requiring assist for smooth movement    Transfers Overall transfer level: Needs assistance   Transfers: Sit to/from Stand Sit to Stand: Min guard                 Balance Overall balance assessment: Needs assistance Sitting-balance support: Feet supported, No upper extremity supported Sitting balance-Leahy Scale: Fair     Standing balance support: During functional activity Standing balance-Leahy Scale: Fair                             ADL either performed or assessed with clinical judgement   ADL Overall ADL's : Needs assistance/impaired     Grooming: Min guard;Standing;Oral care;Wash/dry hands;Wash/dry face Grooming Details (indicate cue type and reason): using counter and RW for stability                             Functional mobility during ADLs: Min guard;Rolling walker (2 wheels) General ADL Comments: Pt able to manipulate comb in hair, but assist required for fine motor tasks for opening/closing toothpaste lid. Pt minguardA for ADL at sink. son in room reports that family will assist as needed.    Extremity/Trunk Assessment Upper Extremity Assessment Upper Extremity Assessment: Generalized weakness RUE Deficits / Details: WFL strength. Poor coordination as seen during finger opposition and alternating UE movement. RUE Coordination: decreased  fine motor;decreased gross motor LUE Deficits / Details: WFL strength. Poor coordination as seen during finger opposition and alternating UE movement. LUE Coordination: decreased fine motor;decreased gross motor   Lower Extremity Assessment Lower Extremity Assessment: Generalized  weakness;Defer to PT evaluation        Vision   Vision Assessment?: No apparent visual deficits Additional Comments: walking in hallway, able to read room signs up close and able to avoid obstacles on either side.   Perception     Praxis      Cognition Arousal/Alertness: Awake/alert Behavior During Therapy: WFL for tasks assessed/performed Overall Cognitive Status: History of cognitive impairments - at baseline Area of Impairment: Orientation, Attention, Memory, Safety/judgement, Following commands, Awareness, Problem solving                 Orientation Level: Disoriented to, Situation, Time Current Attention Level: Sustained, Selective Memory: Decreased short-term memory Following Commands: Follows one step commands with increased time Safety/Judgement: Decreased awareness of safety, Decreased awareness of deficits Awareness: Emergent Problem Solving: Slow processing, Requires verbal cues General Comments: Pt perseverating on lunch time and food arrival time and then this therapist's earrings. Son present. Pt unable to recall date with cues immediately and after 3 minutes.Pt following simple commands, but poor insight into safety awareness- premature sitting on bed led to plopping down.        Exercises      Shoulder Instructions       General Comments son present; reports pt has good support at home    Pertinent Vitals/ Pain       Pain Assessment Pain Assessment: Faces Faces Pain Scale: No hurt Pain Intervention(s): Monitored during session, Repositioned  Home Living                                          Prior Functioning/Environment              Frequency  Min 2X/week        Progress Toward Goals  OT Goals(current goals can now be found in the care plan section)  Progress towards OT goals: Progressing toward goals     Plan Discharge plan needs to be updated    Co-evaluation                 AM-PAC OT "6  Clicks" Daily Activity     Outcome Measure   Help from another person eating meals?: None Help from another person taking care of personal grooming?: A Little Help from another person toileting, which includes using toliet, bedpan, or urinal?: A Lot Help from another person bathing (including washing, rinsing, drying)?: A Lot Help from another person to put on and taking off regular upper body clothing?: None Help from another person to put on and taking off regular lower body clothing?: A Little 6 Click Score: 18    End of Session Equipment Utilized During Treatment: Rolling walker (2 wheels)  OT Visit Diagnosis: Unsteadiness on feet (R26.81);Other abnormalities of gait and mobility (R26.89);Muscle weakness (generalized) (M62.81)   Activity Tolerance Patient tolerated treatment well   Patient Left in bed;with call bell/phone within reach;with bed alarm set   Nurse Communication Mobility status        Time: 5916-3846 OT Time Calculation (min): 25 min  Charges: OT General Charges $OT Visit: 1 Visit OT Treatments $Self Care/Home Management : 8-22 mins $Neuromuscular Re-education: 8-22 mins  Jefferey Pica, OTR/L Acute Rehabilitation Services Pager: [phone] Office: [place] 07/15/2021, 2:38 PM

## 2021-07-16 ENCOUNTER — Inpatient Hospital Stay (HOSPITAL_COMMUNITY): Payer: Medicare HMO

## 2021-07-16 DIAGNOSIS — I63541 Cerebral infarction due to unspecified occlusion or stenosis of right cerebellar artery: Secondary | ICD-10-CM | POA: Diagnosis not present

## 2021-07-16 DIAGNOSIS — I10 Essential (primary) hypertension: Secondary | ICD-10-CM | POA: Diagnosis not present

## 2021-07-16 DIAGNOSIS — F0390 Unspecified dementia without behavioral disturbance: Secondary | ICD-10-CM

## 2021-07-16 DIAGNOSIS — I480 Paroxysmal atrial fibrillation: Secondary | ICD-10-CM | POA: Diagnosis not present

## 2021-07-16 MED ORDER — RIVAROXABAN 15 MG PO TABS
15.0000 mg | ORAL_TABLET | Freq: Every day | ORAL | Status: DC
Start: 2021-07-16 — End: 2021-07-17
  Administered 2021-07-16: 15 mg via ORAL
  Filled 2021-07-16: qty 1

## 2021-07-16 NOTE — Progress Notes (Signed)
STROKE TEAM PROGRESS NOTE   SUBJECTIVE (INTERVAL HISTORY) Her duaghter is at the bedside.  Overall her condition is gradually improving.  No complaints. Pt wants to go home.   OBJECTIVE CBC:  Recent Labs  Lab 07/12/21 1743  WBC 8.4  NEUTROABS 7.4  HGB 13.0  HCT 39.8  MCV 96.8  PLT 314    Basic Metabolic Panel:  Recent Labs  Lab 07/12/21 1743  NA 136  K 3.7  CL 104  CO2 23  GLUCOSE 141*  BUN 18  CREATININE 0.90  CALCIUM 9.3    Lipid Panel:  Recent Labs  Lab 07/13/21 1650  CHOL 214*  TRIG 28  HDL 71  CHOLHDL 3.0  VLDL 6  LDLCALC 137*    HgbA1c:  Recent Labs  Lab 07/13/21 1650  HGBA1C 4.9    Urine Drug Screen: No results for input(s): LABOPIA, COCAINSCRNUR, LABBENZ, AMPHETMU, THCU, LABBARB in the last 168 hours.  Alcohol Level No results for input(s): ETH in the last 168 hours.  IMAGING past 24 hours No results found.   PHYSICAL EXAM Temp:  [97.5 F (36.4 C)-100.1 F (37.8 C)] 97.5 F (36.4 C) (02/26 1142) Pulse Rate:  [51-98] 54 (02/26 1142) Resp:  [16-20] 18 (02/26 1142) BP: (121-153)/(53-77) 145/77 (02/26 1142) SpO2:  [97 %-100 %] 100 % (02/26 1142)  General - Well nourished, well developed, elderly woman in no apparent distress.  Cardiovascular - L JVD. Regular rhythm and rate on telemetry. Although patient with known hx of Afib  Mental Status -  Level of arousal and orientation to place and person were intact. Language including naming and comprehension was assessed and found intact.   Cranial Nerves II - XII - II - Visual field intact OU. III, IV, VI - Extraocular movements intact. V - Facial sensation intact bilaterally. VII - Facial movement intact bilaterally. VIII - Hearing & vestibular intact bilaterally. X - Palate elevates symmetrically. XI - Chin turning & shoulder shrug intact bilaterally. XII - Tongue protrusion with mild L deviation.  Motor Strength - The patients strength was normal in all extremities and pronator  drift was absent.  Bulk was normal and fasciculations were absent.   Motor Tone - Muscle tone was assessed at the neck and appendages and was normal.  Sensory - Light touch was assessed and symmetrical.    Coordination - The patient had normal movements in the hands and feet with no ataxia or dysmetria.  Tremor was absent.  Gait and Station - deferred.    ASSESSMENT/PLAN Ms. Tamaira Ciriello is a 88 y.o. female with history of afib on eliquis, DVT, malignant neoplasm of bladder s/p TURPBT, arthritis, bronchitis, CAD, hypercholesterolemia, HLD and HTN presenting with dizziness and gait ataxia with L lean.   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 Afib failed eliquis Code Stroke CT head No acute abnormality. Chronic L cerebellar infarction/injury MRI  Large acute infarct R PICA territory without hemorrhage MRA  No large vessel occlusion, poor signal of mid-R PICA indicating mod to severe stenosis. No stenosis in neck. Follow-up [facility] 2/24 cytotoxic edema in the right inferior cerebellum in the area of previously noted right PICA territory infarct, with some petechial hemorrhage but no hemorrhagic conversion. Mass effect on the fourth ventricle and 4 mm of right-to-left midline shift in the posterior fossa. 2D Echo EF 60 to 65% LDL 137 HgbA1c 4.9 VTE prophylaxis - SCDs    Diet   Diet Heart Room  service appropriate? Yes; Fluid consistency: Thin   aspirin 81 mg daily and Eliquis (apixaban) daily prior to admission, now on aspirin 325 mg daily. Start xeralto tonight after repeat HCT. Therapy recommendations:  AIR Disposition:  Due to the size of patient's infarct, recommend that she be monitored over the weekend to ensure no hydrocephalus will develop.  Afib On Eliquis PTA Daughter confirmed patient compliant with medication On aspirin now, Start xeralto tonight after repeat HCT.  Hypertension Home meds:  Amlodipine 5 mg BID,  Lasix 20 mg qOD Stable Gradually normalize in 5-7 days Long-term BP goal normotensive  Hyperlipidemia Home meds:  Atorvastatin 20 mg (non-compliant) LDL 137, goal < 70 Increased to 80 mg Continue statin at discharge  Other Stroke Risk Factors Advanced Age >/= 12  Former Cigarette smoker; quit 59 years ago Coronary artery disease  Other Active Problems Bladder cancer status post TURBT  Hospital day # 3  Pt with right cerebellar ischemic stroke while on eliquis. Exam is stable. She is ready to go home. Updated pt and daughter at bedside on her dx. Transition to xeralto due to eliquis failure. Start dose tonight after HCT completed around 5pm. D/C pt home tomorrow if exam is stable.    Total of 35 mins spent reviewing chart, discussion with patient and family on prognosis, Dx and plan. Discussed case with patient's nurse. Reviewed Imaging personally.   Deania Siguenza,MD    To contact Stroke Continuity provider, please refer to [email or web address] After hours, contact General Neurology

## 2021-07-16 NOTE — Progress Notes (Signed)
[facility] Progress Note  Patient: Krista Joseph    ULA:[id number]  DOA: 07/12/2021    Date of Service: the patient was seen and examined on 07/16/2021  Brief hospital course: 88 year old female with past medical history of hypertension, atrial fibrillation on anticoagulation neoplasm of the bladder status post TURP and GERD presented to the emergency room on 2/23 with complaints of sudden onset dizziness that had started earlier that afternoon.  CT scan noted chronic left cerebellar infarct, but MRI noted large acute right PICA stroke and patient was brought into the hospitalist service for further evaluation and treatment.  Neurology consulted.  Assessment and Plan: Assessment and Plan: * CVA (cerebral vascular accident) ([place])- (present on admission) MRI was significant for a large acute right PICA stroke.  Repeat CT notes no worsening.  Seen by PT who is recommending inpatient rehab, but patient declined and family wants home health.  Hold anticoagulation x1 week because of concerns for hemorrhagic transformation.  Echocardiogram unremarkable.  As per neurology original recommendations, monitor through the weekend given large size of stroke.  Repeat head CT this afternoon  Malignant neoplasm of bladder ([place])- (present on admission) Initially diagnosed with high-grade invasive urothelial carcinoma in 2018.  Patient underwent TURBT, but repeat biopsies in 2019 showed return of cancer.  She is felt to be a surgical candidate at that time and underwent treatment with Pembro every 3 weeks started in 10/2017 with palliative intent.  Patient had did well until progression of the mass into her pelvis for which palliative radiation of the pelvic mass was performed mid 2020.  Patient has been off therapy since that time. -Continue outpatient follow-up with hematology oncology to [facility]  Paroxysmal atrial fibrillation [facility])- (present on admission) Patient has a history of paroxysmal atrial fibrillation  on chronic anticoagulation of Eliquis.  She appears to be rate controlled at this time. -Orders were initially placed to continue Eliquis, but after imaging noted size of a large stroke was held due to possible hemorrhagic conversion-changing to Eliquis  Essential hypertension- (present on admission) Home blood pressure regimen includes amlodipine 5 mg daily and furosemide 20 mg every other day. -Allowing for permissive hypertension at this time, although not much because of large size of CVA. -Resume home blood pressure regimen when medically appropriate  Senile dementia uncomp, without behavioral disturbance ([place])- (present on admission) At baseline.  Hyperlipidemia- (present on admission) Initially on Lipitor 20 at home.  LDL at 137 and Lipitor increased to 80.   Acid reflux- (present on admission) Continue PPI       Body mass index is 22.86 kg/m.        Consultants: Neurology  Procedures: Echocardiogram: No evidence of acute clot.  Ejection fraction stable.  Antimicrobials: None  Code Status: Full code   Subjective: Patient with no complaints.    Objective: Noted elevated blood pressure, stable Vitals:   07/16/21 0752 07/16/21 1142  BP: (!) 148/75 (!) 145/77  Pulse: 62 (!) 54  Resp: 20 18  Temp: 100.1 F (37.8 C) (!) 97.5 F (36.4 C)  SpO2: 98% 100%    Intake/Output Summary (Last 24 hours) at 07/16/2021 1403 Last data filed at 07/16/2021 0340 Gross per 24 hour  Intake --  Output 550 ml  Net -550 ml    Filed Weights   07/12/21 1724  Weight: 54.9 kg   Body mass index is 22.86 kg/m.  Exam:  General: Alert and oriented x2, at baseline HEENT: Normocephalic and atraumatic, mucous membranes slightly dry Cardiovascular: Irregular  rhythm, rate controlled Respiratory: Clear to auscultation bilaterally Abdomen: Soft, nontender, nondistended, positive bowel sounds Musculoskeletal: No clubbing or cyanosis or edema Skin: No skin breaks, tears or  lesions Psychiatry: Appropriate, no evidence of psychoses Neurology: No focal deficits  Data Reviewed: Noted lipid panel with LDL of 137  Disposition:  Status is: Inpatient Remains inpatient appropriate because: Repeat head CT today needed.  Anticipate discharge 2/27    Family Communication: Daughter and granddaughter at the bedside DVT Prophylaxis: SCDs     Author: Annita Brod ,MD 07/16/2021 2:03 PM  To reach On-call, see care teams to locate the attending and reach out via [email or web address]. Between 7PM-7AM, please contact night-coverage If you still have difficulty reaching the attending provider, please page the [facility] (Director on Call) for [facility] on amion for assistance.

## 2021-07-16 NOTE — Plan of Care (Signed)
Pt is alert oriented x 3, pt uses walker, unsteadyDenies numbness or tingilng to arms or legs . No distress note.   Problem: Education: Goal: Knowledge of General Education information will improve Description: Including pain rating scale, medication(s)/side effects and non-pharmacologic comfort measures 07/16/2021 0324 by Allayne Stack, RN Outcome: Progressing 07/16/2021 0207 by Allayne Stack, RN Outcome: Progressing   Problem: Health Behavior/Discharge Planning: Goal: Ability to manage health-related needs will improve 07/16/2021 0324 by Allayne Stack, RN Outcome: Progressing 07/16/2021 0207 by Allayne Stack, RN Outcome: Progressing   Problem: Activity: Goal: Risk for activity intolerance will decrease 07/16/2021 0324 by Allayne Stack, RN Outcome: Progressing 07/16/2021 0207 by Allayne Stack, RN Outcome: Progressing   Problem: Nutrition: Goal: Adequate nutrition will be maintained 07/16/2021 0324 by Allayne Stack, RN Outcome: Progressing 07/16/2021 0207 by Allayne Stack, RN Outcome: Progressing   Problem: Coping: Goal: Level of anxiety will decrease 07/16/2021 0324 by Allayne Stack, RN Outcome: Progressing 07/16/2021 0207 by Allayne Stack, RN Outcome: Progressing   Problem: Elimination: Goal: Will not experience complications related to bowel motility 07/16/2021 0324 by Allayne Stack, RN Outcome: Progressing 07/16/2021 0207 by Allayne Stack, RN Outcome: Progressing Goal: Will not experience complications related to urinary retention 07/16/2021 0324 by Allayne Stack, RN Outcome: Progressing 07/16/2021 0207 by Allayne Stack, RN Outcome: Progressing   Problem: Pain Managment: Goal: General experience of comfort will improve 07/16/2021 0324 by Allayne Stack, RN Outcome: Progressing 07/16/2021 0207 by Allayne Stack, RN Outcome: Progressing   Problem: Safety: Goal: Ability to remain free from injury  will improve 07/16/2021 0324 by Allayne Stack, RN Outcome: Progressing 07/16/2021 0207 by Allayne Stack, RN Outcome: Progressing   Problem: Skin Integrity: Goal: Risk for impaired skin integrity will decrease 07/16/2021 0324 by Allayne Stack, RN Outcome: Progressing 07/16/2021 0207 by Allayne Stack, RN Outcome: Progressing

## 2021-07-17 ENCOUNTER — Other Ambulatory Visit (HOSPITAL_COMMUNITY): Payer: Self-pay

## 2021-07-17 DIAGNOSIS — F0390 Unspecified dementia without behavioral disturbance: Secondary | ICD-10-CM | POA: Diagnosis not present

## 2021-07-17 DIAGNOSIS — I480 Paroxysmal atrial fibrillation: Secondary | ICD-10-CM | POA: Diagnosis not present

## 2021-07-17 DIAGNOSIS — I63541 Cerebral infarction due to unspecified occlusion or stenosis of right cerebellar artery: Secondary | ICD-10-CM | POA: Diagnosis not present

## 2021-07-17 DIAGNOSIS — I10 Essential (primary) hypertension: Secondary | ICD-10-CM | POA: Diagnosis not present

## 2021-07-17 MED ORDER — RIVAROXABAN 15 MG PO TABS: 15.0000 mg | ORAL_TABLET | Freq: Every day | ORAL | 1 refills | Status: AC

## 2021-07-17 MED ORDER — ATORVASTATIN CALCIUM 80 MG PO TABS: 80.0000 mg | ORAL_TABLET | Freq: Every day | ORAL | 1 refills | Status: AC

## 2021-07-17 NOTE — Plan of Care (Signed)
Pt is alert oriented x 2-3. No distress noted. Pt has been irritable when waking up to complete assessments. Education provided regarding assessments. Pt denies pain. Xarelto started per order. No distress noted. Pts daughter present at bedside.    Problem: Education: Goal: Knowledge of General Education information will improve Description: Including pain rating scale, medication(s)/side effects and non-pharmacologic comfort measures Outcome: Progressing   Problem: Health Behavior/Discharge Planning: Goal: Ability to manage health-related needs will improve Outcome: Progressing   Problem: Clinical Measurements: Goal: Ability to maintain clinical measurements within normal limits will improve Outcome: Progressing Goal: Will remain free from infection Outcome: Progressing Goal: Diagnostic test results will improve Outcome: Progressing Goal: Respiratory complications will improve Outcome: Progressing Goal: Cardiovascular complication will be avoided Outcome: Progressing   Problem: Activity: Goal: Risk for activity intolerance will decrease Outcome: Progressing   Problem: Nutrition: Goal: Adequate nutrition will be maintained Outcome: Progressing   Problem: Coping: Goal: Level of anxiety will decrease Outcome: Progressing   Problem: Elimination: Goal: Will not experience complications related to bowel motility Outcome: Progressing Goal: Will not experience complications related to urinary retention Outcome: Progressing   Problem: Pain Managment: Goal: General experience of comfort will improve Outcome: Progressing   Problem: Safety: Goal: Ability to remain free from injury will improve Outcome: Progressing   Problem: Skin Integrity: Goal: Risk for impaired skin integrity will decrease Outcome: Progressing

## 2021-07-17 NOTE — Plan of Care (Signed)
Pt has been doing well. Family at bedside. [place] in place.   Problem: Education: Goal: Knowledge of General Education information will improve Description: Including pain rating scale, medication(s)/side effects and non-pharmacologic comfort measures Outcome: Progressing   Problem: Health Behavior/Discharge Planning: Goal: Ability to manage health-related needs will improve Outcome: Progressing   Problem: Clinical Measurements: Goal: Ability to maintain clinical measurements within normal limits will improve Outcome: Progressing Goal: Will remain free from infection Outcome: Progressing Goal: Diagnostic test results will improve Outcome: Progressing Goal: Respiratory complications will improve Outcome: Progressing Goal: Cardiovascular complication will be avoided Outcome: Progressing   Problem: Activity: Goal: Risk for activity intolerance will decrease Outcome: Progressing   Problem: Nutrition: Goal: Adequate nutrition will be maintained Outcome: Progressing   Problem: Coping: Goal: Level of anxiety will decrease Outcome: Progressing   Problem: Elimination: Goal: Will not experience complications related to bowel motility Outcome: Progressing Goal: Will not experience complications related to urinary retention Outcome: Progressing   Problem: Pain Managment: Goal: General experience of comfort will improve Outcome: Progressing   Problem: Safety: Goal: Ability to remain free from injury will improve Outcome: Progressing   Problem: Skin Integrity: Goal: Risk for impaired skin integrity will decrease Outcome: Progressing

## 2021-07-17 NOTE — Progress Notes (Addendum)
STROKE TEAM PROGRESS NOTE   SUBJECTIVE (INTERVAL HISTORY) Her daughter is at the bedside.  Overall her condition is stable.  No complaints. Pt wants to go home.   Follow-up CT head with unchanged appearance of large right cerebellar infarct and improved mass-effect on the cerebral aqueduct.  OBJECTIVE CBC:  Recent Labs  Lab 07/12/21 1743  WBC 8.4  NEUTROABS 7.4  HGB 13.0  HCT 39.8  MCV 96.8  PLT 329    Basic Metabolic Panel:  Recent Labs  Lab 07/12/21 1743  NA 136  K 3.7  CL 104  CO2 23  GLUCOSE 141*  BUN 18  CREATININE 0.90  CALCIUM 9.3    Lipid Panel:  Recent Labs  Lab 07/13/21 1650  CHOL 214*  TRIG 28  HDL 71  CHOLHDL 3.0  VLDL 6  LDLCALC 137*    HgbA1c:  Recent Labs  Lab 07/13/21 1650  HGBA1C 4.9    Urine Drug Screen: No results for input(s): LABOPIA, COCAINSCRNUR, LABBENZ, AMPHETMU, THCU, LABBARB in the last 168 hours.  Alcohol Level No results for input(s): ETH in the last 168 hours.  IMAGING past 24 hours CT HEAD WO CONTRAST (5MM)  Result Date: 07/16/2021 CLINICAL DATA:  Stroke follow-up EXAM: CT HEAD WITHOUT CONTRAST TECHNIQUE: Contiguous axial images were obtained from the base of the skull through the vertex without intravenous contrast. RADIATION DOSE REDUCTION: This exam was performed according to the departmental dose-optimization program which includes automated exposure control, adjustment of the mA and/or kV according to patient size and/or use of iterative reconstruction technique. COMPARISON:  07/14/2021 FINDINGS: Brain: Unchanged appearance of large right cerebellar infarct with mildly improved mass effect on the cerebral aqueduct. Old left cerebellar infarct is also unchanged. There is periventricular hypoattenuation compatible with chronic microvascular disease. Mild generalized volume loss. Vascular: No abnormal hyperdensity of the major intracranial arteries or dural venous sinuses. No intracranial atherosclerosis. Skull: The  visualized skull base, calvarium and extracranial soft tissues are normal. Sinuses/Orbits: No fluid levels or advanced mucosal thickening of the visualized paranasal sinuses. No mastoid or middle ear effusion. The orbits are normal. IMPRESSION: 1. Unchanged appearance of large right cerebellar infarct with mildly improved mass effect on the cerebral aqueduct. 2. Old left cerebellar infarct. Electronically Signed   By: Ulyses Jarred M.D.   On: 07/16/2021 22:58     PHYSICAL EXAM Temp:  [97.5 F (36.4 C)-99.1 F (37.3 C)] 98.1 F (36.7 C) (02/27 0723) Pulse Rate:  [50-63] 53 (02/27 0723) Resp:  [14-20] 14 (02/27 0723) BP: (133-145)/(57-77) 133/64 (02/27 0723) SpO2:  [96 %-100 %] 96 % (02/27 0723)  General - Well nourished, well developed, elderly woman in no apparent distress.  Cardiovascular - L JVD. Regular rhythm and rate on telemetry. Although patient with known hx of Afib  Mental Status -  Level of arousal and orientation to place and person were intact. Language including naming and comprehension was assessed and found intact.   Cranial Nerves II - XII - II - Visual field intact OU. III, IV, VI - Extraocular movements intact. V - Facial sensation intact bilaterally. VII - Facial movement intact bilaterally. VIII - Hearing & vestibular intact bilaterally. X - Palate elevates symmetrically. XI - Chin turning & shoulder shrug intact bilaterally. XII - Tongue protrusion with mild L deviation.  Motor Strength - The patients strength was normal in all extremities and pronator drift was absent.  Bulk was normal and fasciculations were absent.   Motor Tone - Muscle tone was assessed at  the neck and appendages and was normal.  Sensory - Light touch was assessed and symmetrical.    Coordination - The patient had normal movements in the hands and feet with no ataxia or dysmetria.  Tremor was absent.  Gait and Station - deferred.    ASSESSMENT/PLAN [name] is a 88 y.o.  female with history of afib on eliquis, DVT, malignant neoplasm of bladder s/p TURPBT, arthritis, bronchitis, CAD, hypercholesterolemia, HLD and HTN presenting with dizziness and gait ataxia with L lean.   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 Afib failed eliquis Code Stroke CT head No acute abnormality. Chronic L cerebellar infarction/injury MRI  Large acute infarct R PICA territory without hemorrhage MRA  No large vessel occlusion, poor signal of mid-R PICA indicating mod to severe stenosis. No stenosis in neck. Follow-up [facility] 2/24 cytotoxic edema in the right inferior cerebellum in the area of previously noted right PICA territory infarct, with some petechial hemorrhage but no hemorrhagic conversion. Mass effect on the fourth ventricle and 4 mm of right-to-left midline shift in the posterior fossa. Follow-up [facility] 2/26 unchanged appearance of large right cerebellar infarct; improved mass-effect on the cerebral aqueduct 2D Echo EF 60 to 65% LDL 137 HgbA1c 4.9 VTE prophylaxis - SCDs    Diet   Diet Heart Room service appropriate? Yes; Fluid consistency: Thin   aspirin 81 mg daily and Eliquis (apixaban) daily prior to admission, now on Xarelto (rivaroxaban) daily.  CT head stable Therapy recommendations: Home health PT and OT Disposition: Home upon discharge, per primary team  Afib On Eliquis PTA Daughter confirmed patient compliant with medication Will continue with Xarelto 15 mg daily since the patient failed Eliquis.   Hypertension Home meds:  Amlodipine 5 mg BID, Lasix 20 mg qOD Stable Gradually normalize in 5-7 days Long-term BP goal normotensive  Hyperlipidemia Home meds:  Atorvastatin 20 mg (non-compliant) LDL 137, goal < 70 Increased to 80 mg Continue statin at discharge  Other Stroke Risk Factors Advanced Age >/= 63  Former Cigarette smoker; quit 59 years ago Coronary artery disease  Other Active  Problems Bladder cancer status post TURBT  Hospital day # 4  Pt seen by Neuro Psych Resident and later by attending MD. Note/plan to be edited by MD as needed.    Rosezetta Schlatter, MD PGY-1 07/17/2021  11:34 AM   ATTENDING ATTESTATION:  Agree with above. Exam is unchanged from yesterday. Start with xeralto 15mg , as she is petite. She will f/u in stroke clinic.  D/C today.  Dr. Reeves Forth evaluated pt independently, reviewed imaging, chart, labs. Discussed and formulated plan with the APP. Please see APP note above for details.   Total 36 minutes spent on counseling patient and coordinating care, writing notes and reviewing chart.  Aviance Cooperwood,MD    To contact Stroke Continuity provider, please refer to [email or web address] After hours, contact General Neurology

## 2021-07-17 NOTE — Discharge Summary (Signed)
Physician Discharge Summary   Patient: Krista Joseph MRN: [id number] DOB: 07-Jul-1929  Admit date:     07/12/2021  Discharge date: 07/17/21  Discharge Physician: Annita Brod   PCP: Secundino Ginger, PA-C   Recommendations at discharge:   New medication: Xarelto 15 mg p.o. daily Medication change: Eliquis, naproxen, Norvasc discontinued New medication: Lipitor 80 mg p.o. daily  Discharge Diagnoses: Principal Problem:   CVA (cerebral vascular accident) [facility]) Active Problems:   Malignant neoplasm of bladder ([place])   Paroxysmal atrial fibrillation ([place])   Essential hypertension   Hyperlipidemia   Senile dementia uncomp, without behavioral disturbance (HCC)   Acid reflux  Resolved Problems:   * No resolved hospital problems. *   Hospital Course: 88 year old female with past medical history of hypertension, atrial fibrillation on anticoagulation neoplasm of the bladder status post TURP and GERD presented to the emergency room on 2/23 with complaints of sudden onset dizziness that had started earlier that afternoon.  CT scan noted chronic left cerebellar infarct, but MRI noted large acute right PICA stroke and patient was brought into the hospitalist service for further evaluation and treatment.  Neurology consulted.  Patient remained stable.  Continued observation and delay in restarting full anticoagulation to ensure no hemorrhagic transformation.  Assessment and Plan: * CVA (cerebral vascular accident) ([place])- (present on admission) MRI was significant for a large acute right PICA stroke.  Repeat CT notes no worsening.  Seen by PT who is recommending inpatient rehab, but patient declined and family wants home health.  Hold anticoagulation x1 week because of concerns for hemorrhagic transformation.  Echocardiogram unremarkable.  As per neurology original recommendations, extended monitoring given large size of stroke.  Repeat head CT is remained stable with actual mild improvement  in mass effect.  Due to failure of Eliquis, patient started on Xarelto.  PT recommended skilled nursing, but patient and family declined and patient went home with home health PT.  Malignant neoplasm of bladder ([place])- (present on admission) Initially diagnosed with high-grade invasive urothelial carcinoma in 2018.  Patient underwent TURBT, but repeat biopsies in 2019 showed return of cancer.  She is felt to be a surgical candidate at that time and underwent treatment with Pembro every 3 weeks started in 10/2017 with palliative intent.  Patient had did well until progression of the mass into her pelvis for which palliative radiation of the pelvic mass was performed mid 2020.  Patient has been off therapy since that time. -Continue outpatient follow-up with hematology oncology to [facility]  Paroxysmal atrial fibrillation [facility])- (present on admission) Patient has a history of paroxysmal atrial fibrillation on chronic anticoagulation of Eliquis.  She appears to be rate controlled at this time. -Orders were initially placed to continue Eliquis, but after imaging noted size of a large stroke, anticoagulation initially delayed.  Due to possible failure in therapy, Xarelto started in place of.  Essential hypertension- (present on admission) Home blood pressure regimen includes amlodipine 5 mg daily and furosemide 20 mg every other day. -Allowing for permissive hypertension at this time, although not much because of large size of CVA. -Resume home blood pressure regimen upon discharge  Senile dementia uncomp, without behavioral disturbance ([place])- (present on admission) At baseline.  Hyperlipidemia- (present on admission) Initially on Lipitor 20 at home.  LDL at 137 and Lipitor increased to 80.   Acid reflux- (present on admission) Continue PPI      Consultants: Neurology Procedures performed: Echocardiogram Disposition: Home Diet recommendation:  Discharge Diet Orders (From admission,  onward)      Start     Ordered   07/17/21 0000  Diet - low sodium heart healthy        07/17/21 1444             DISCHARGE MEDICATION: Allergies as of 07/17/2021       Reactions   Ivp Dye [iodinated Contrast Media] Itching, Other (See Comments)   Patient states that her blood pressure went up.   Latex Itching        Medication List     STOP taking these medications    amLODipine 5 MG tablet Commonly known as: NORVASC   Eliquis 2.5 MG Tabs tablet Generic drug: apixaban   naproxen sodium 220 MG tablet Commonly known as: ALEVE       TAKE these medications    aspirin 81 MG tablet Take 81 mg by mouth daily.   atorvastatin 80 MG tablet Commonly known as: LIPITOR Take 1 tablet (80 mg total) by mouth daily. Start taking on: July 18, 2021 What changed:  medication strength how much to take   furosemide 20 MG tablet Commonly known as: LASIX Take 20 mg by mouth every other day.   Rivaroxaban 15 MG Tabs tablet Commonly known as: XARELTO Take 1 tablet (15 mg total) by mouth daily with supper.   traMADol 50 MG tablet Commonly known as: ULTRAM Take 50 mg by mouth every 6 (six) hours as needed for pain.   Vitamin D (Ergocalciferol) 1.25 MG (50000 UNIT) Caps capsule Commonly known as: DRISDOL Take 50,000 Units by mouth once a week.               Durable Medical Equipment  (From admission, onward)           Start     Ordered   07/14/21 1350  For home use only DME 3 n 1  Once        07/14/21 1349   07/14/21 1350  For home use only DME Walker rolling  Once       Question Answer Comment  Walker: With 5 Inch Wheels   Patient needs a walker to treat with the following condition Stroke ([place])      07/14/21 1349            Follow-up Information     [facility].   Specialty: Home Health Services Why: The home health agency will contact you for the first home visit. Contact information: [address] [place]  [address] [phone]                 Discharge Exam: Danley Danker Weights   07/12/21 1724  Weight: 54.9 kg   General: Alert and oriented x3, no acute distress Cardiovascular: Regular rate and rhythm, S1-S2 Lungs: Clear to auscultation bilaterally  Condition at discharge: good  The results of significant diagnostics from this hospitalization (including imaging, microbiology, ancillary and laboratory) are listed below for reference.   Imaging Studies: CT HEAD WO CONTRAST (5MM)  Result Date: 07/16/2021 CLINICAL DATA:  Stroke follow-up EXAM: CT HEAD WITHOUT CONTRAST TECHNIQUE: Contiguous axial images were obtained from the base of the skull through the vertex without intravenous contrast. RADIATION DOSE REDUCTION: This exam was performed according to the departmental dose-optimization program which includes automated exposure control, adjustment of the mA and/or kV according to patient size and/or use of iterative reconstruction technique. COMPARISON:  07/14/2021 FINDINGS: Brain: Unchanged appearance of large right cerebellar infarct  with mildly improved mass effect on the cerebral aqueduct. Old left cerebellar infarct is also unchanged. There is periventricular hypoattenuation compatible with chronic microvascular disease. Mild generalized volume loss. Vascular: No abnormal hyperdensity of the major intracranial arteries or dural venous sinuses. No intracranial atherosclerosis. Skull: The visualized skull base, calvarium and extracranial soft tissues are normal. Sinuses/Orbits: No fluid levels or advanced mucosal thickening of the visualized paranasal sinuses. No mastoid or middle ear effusion. The orbits are normal. IMPRESSION: 1. Unchanged appearance of large right cerebellar infarct with mildly improved mass effect on the cerebral aqueduct. 2. Old left cerebellar infarct. Electronically Signed   By: Ulyses Jarred M.D.   On: 07/16/2021 22:58   CT HEAD WO CONTRAST (5MM)  Result Date:  07/14/2021 CLINICAL DATA:  Stroke, follow-up EXAM: CT HEAD WITHOUT CONTRAST TECHNIQUE: Contiguous axial images were obtained from the base of the skull through the vertex without intravenous contrast. RADIATION DOSE REDUCTION: This exam was performed according to the departmental dose-optimization program which includes automated exposure control, adjustment of the mA and/or kV according to patient size and/or use of iterative reconstruction technique. COMPARISON:  CT head 07/12/2021, correlation is also made with MRI head 07/13/2021. FINDINGS: Brain: Increased hypodensity in the right cerebellar hemisphere, consistent with the acute PICA territory infarct seen on the prior MRI. Areas of curvilinear increased density in this area likely represent petechial hemorrhage. Cytotoxic edema causes effacement of the cerebellar sulci and fourth ventricle, with approximately 4 mm of right-to-left midline shift in the cerebellum. The basilar cisterns, prepontine cistern, and pontomedullary cisterns are preserved, with no evidence of tonsillar herniation through the foramen magnum. Unchanged size and configuration of the lateral and third ventricles. No extra-axial collection. No new infarct, significant parenchymal hemorrhage, or cerebral midline shift. Vascular: No hyperdense vessel. Atherosclerotic calcifications in the intracranial carotid and vertebral arteries. Skull: Normal. Negative for fracture or focal lesion. Sinuses/Orbits: No acute finding. Status post left lens replacement. Other: The mastoids are well aerated. IMPRESSION: Cytotoxic edema in the right inferior cerebellum in the area of previously noted right PICA territory infarct, with some petechial hemorrhage but no hemorrhagic conversion. Mass effect on the fourth ventricle and 4 mm of right-to-left midline shift in the posterior fossa. No evidence of hydrocephalus, cerebral midline shift, or tonsillar herniation. Electronically Signed   By: Merilyn Baba M.D.    On: 07/14/2021 19:15   CT Head Wo Contrast  Result Date: 07/12/2021 CLINICAL DATA:  Vertigo, central EXAM: CT HEAD WITHOUT CONTRAST TECHNIQUE: Contiguous axial images were obtained from the base of the skull through the vertex without intravenous contrast. RADIATION DOSE REDUCTION: This exam was performed according to the departmental dose-optimization program which includes automated exposure control, adjustment of the mA and/or kV according to patient size and/or use of iterative reconstruction technique. COMPARISON:  CT head 06/10/2014 BRAIN: BRAIN Cerebral ventricle sizes are concordant with the degree of cerebral volume loss. Patchy and confluent areas of decreased attenuation are noted throughout the deep and periventricular white matter of the cerebral hemispheres bilaterally, compatible with chronic microvascular ischemic disease. Left cerebellar since encephalomalacia. Left basal ganglia lacunar infarction. No evidence of large-territorial acute infarction. No parenchymal hemorrhage. No mass lesion. No extra-axial collection. No mass effect or midline shift. No hydrocephalus. Basilar cisterns are patent. Vascular: No hyperdense vessel. Atherosclerotic calcifications are present within the cavernous internal carotid and vertebral arteries. Skull: No acute fracture or focal lesion. Sinuses/Orbits: Paranasal sinuses and mastoid air cells are clear. Left lens replacement. Otherwise the orbits are unremarkable.  Other: None. IMPRESSION: No acute intracranial abnormality in a patient with chronic left cerebellar infarction/injury. Electronically Signed   By: Iven Finn M.D.   On: 07/12/2021 19:20   MR ANGIO HEAD WO CONTRAST  Result Date: 07/13/2021 CLINICAL DATA:  Acute infarct in the right parasellar territory on same-day MRI EXAM: MRA NECK WITHOUT CONTRAST MRA HEAD WITHOUT CONTRAST TECHNIQUE: Angiographic images of the Circle of Willis were acquired using MRA technique without intravenous  contrast. COMPARISON:  MR head 07/13/2021, CT head 07/12/2021, no prior MRA FINDINGS: MRA NECK FINDINGS Standard aortic branching. No evidence of dissection or aneurysm. Common, internal, and external carotid arteries are patent, without hemodynamically significant stenosis; diminished flow in a segment of the left ICA (series 6, image 56) is favored to be artifactual and secondary to the direction of flow. Extracranial vertebral arteries are patent, without hemodynamically significant stenosis. MRA HEAD FINDINGS Both internal carotid arteries are patent to the termini, without significant stenosis. A1 segments patent. Normal anterior communicating artery. Anterior cerebral arteries are patent to their distal aspects. No M1 stenosis or occlusion. Normal MCA bifurcations. Distal MCA branches perfused and symmetric. Vertebral arteries patent to the vertebrobasilar junction without stenosis. Posterior inferior cerebral arteries patent proximally, with areas of poor signal in the mid right PICA (series 2, image 37). Basilar patent to its distal aspect. Superior cerebellar arteries patent bilaterally. Patent P1 segments. PCAs perfused to their distal aspects without stenosis. The bilateral posterior communicating arteries are not visualized. IMPRESSION: 1. No large vessel occlusion. There are areas of poor signal within the mid right PICA, likely indicating moderate to severe stenosis. No other hemodynamically significant intracranial stenosis. 2. No hemodynamically significant stenosis in the neck. An area of apparent diminished flow in a segment of the left ICA is favored to be artifactual, secondary to the direction of flow. Electronically Signed   By: Merilyn Baba M.D.   On: 07/13/2021 23:37   MR ANGIO NECK WO CONTRAST  Result Date: 07/13/2021 CLINICAL DATA:  Acute infarct in the right parasellar territory on same-day MRI EXAM: MRA NECK WITHOUT CONTRAST MRA HEAD WITHOUT CONTRAST TECHNIQUE: Angiographic images of  the Circle of Willis were acquired using MRA technique without intravenous contrast. COMPARISON:  MR head 07/13/2021, CT head 07/12/2021, no prior MRA FINDINGS: MRA NECK FINDINGS Standard aortic branching. No evidence of dissection or aneurysm. Common, internal, and external carotid arteries are patent, without hemodynamically significant stenosis; diminished flow in a segment of the left ICA (series 6, image 56) is favored to be artifactual and secondary to the direction of flow. Extracranial vertebral arteries are patent, without hemodynamically significant stenosis. MRA HEAD FINDINGS Both internal carotid arteries are patent to the termini, without significant stenosis. A1 segments patent. Normal anterior communicating artery. Anterior cerebral arteries are patent to their distal aspects. No M1 stenosis or occlusion. Normal MCA bifurcations. Distal MCA branches perfused and symmetric. Vertebral arteries patent to the vertebrobasilar junction without stenosis. Posterior inferior cerebral arteries patent proximally, with areas of poor signal in the mid right PICA (series 2, image 37). Basilar patent to its distal aspect. Superior cerebellar arteries patent bilaterally. Patent P1 segments. PCAs perfused to their distal aspects without stenosis. The bilateral posterior communicating arteries are not visualized. IMPRESSION: 1. No large vessel occlusion. There are areas of poor signal within the mid right PICA, likely indicating moderate to severe stenosis. No other hemodynamically significant intracranial stenosis. 2. No hemodynamically significant stenosis in the neck. An area of apparent diminished flow in a segment of the  left ICA is favored to be artifactual, secondary to the direction of flow. Electronically Signed   By: Merilyn Baba M.D.   On: 07/13/2021 23:37   MR BRAIN WO CONTRAST  Result Date: 07/13/2021 CLINICAL DATA:  Acute neuro deficit.  Central vertigo EXAM: MRI HEAD WITHOUT CONTRAST TECHNIQUE:  Multiplanar, multiecho pulse sequences of the brain and surrounding structures were obtained without intravenous contrast. COMPARISON:  CT head 07/12/2021 FINDINGS: Brain: Acute infarct right PICA territory. This is relatively large area of infarction measuring 5 cm in diameter. No other acute infarct Chronic infarct left posterior cerebellum. Moderate chronic microvascular ischemic change in the white matter. Chronic infarct left thalamus. Moderate atrophy. Negative for hemorrhage or mass. Vascular: Normal arterial flow voids Skull and upper cervical spine: No focal skeletal lesion. Sinuses/Orbits: Paranasal sinuses clear.  Left cataract extraction Other: Dermal lipoma posterior to the right mastoid sinus measuring 28 x 15 mm. IMPRESSION: Relatively large acute infarct right PICA territory without hemorrhage. Atrophy and chronic ischemic changes as described above. Electronically Signed   By: Franchot Gallo M.D.   On: 07/13/2021 14:14   ECHOCARDIOGRAM COMPLETE  Result Date: 07/14/2021    ECHOCARDIOGRAM REPORT   Patient Name:   Krista Joseph Date of Exam: 07/14/2021 Medical Rec #:  [id number]       Height:       61.0 in Accession #:    [id number]      Weight:       121.0 lb Date of Birth:  April 29, 1930       BSA:          1.526 m Patient Age:    21 years        BP:           144/73 mmHg Patient Gender: F               HR:           58 bpm. Exam Location:  Inpatient Procedure: 2D Echo, Cardiac Doppler and Color Doppler Indications:    Stroke  History:        Patient has no prior history of Echocardiogram examinations.                 CAD, Arrythmias:Atrial Fibrillation; Risk Factors:Dyslipidemia                 and Hypertension. Hx DVT.  Sonographer:    Clayton Lefort RDCS (AE) Referring Phys: [id number] RONDELL A SMITH IMPRESSIONS  1. Left ventricular ejection fraction, by estimation, is 60 to 65%. The left ventricle has normal function. The left ventricle has no regional wall motion abnormalities. There is moderate left  ventricular hypertrophy. Left ventricular diastolic parameters are consistent with Grade I diastolic dysfunction (impaired relaxation).  2. Right ventricular systolic function is normal. The right ventricular size is normal.  3. Left atrial size was moderately dilated.  4. The mitral valve is normal in structure. No evidence of mitral valve regurgitation. No evidence of mitral stenosis.  5. The aortic valve is tricuspid. There is mild calcification of the aortic valve. Aortic valve regurgitation is mild. Aortic valve sclerosis/calcification is present, without any evidence of aortic stenosis.  6. The inferior vena cava is normal in size with greater than 50% respiratory variability, suggesting right atrial pressure of 3 mmHg. FINDINGS  Left Ventricle: Left ventricular ejection fraction, by estimation, is 60 to 65%. The left ventricle has normal function. The left ventricle has no regional wall motion abnormalities. The left ventricular  internal cavity size was normal in size. There is  moderate left ventricular hypertrophy. Left ventricular diastolic parameters are consistent with Grade I diastolic dysfunction (impaired relaxation). Right Ventricle: The right ventricular size is normal. No increase in right ventricular wall thickness. Right ventricular systolic function is normal. Left Atrium: Left atrial size was moderately dilated. Right Atrium: Right atrial size was normal in size. Pericardium: There is no evidence of pericardial effusion. Mitral Valve: The mitral valve is normal in structure. No evidence of mitral valve regurgitation. No evidence of mitral valve stenosis. Tricuspid Valve: The tricuspid valve is normal in structure. Tricuspid valve regurgitation is trivial. No evidence of tricuspid stenosis. Aortic Valve: The aortic valve is tricuspid. There is mild calcification of the aortic valve. Aortic valve regurgitation is mild. Aortic valve sclerosis/calcification is present, without any evidence of  aortic stenosis. Aortic valve mean gradient measures 5.0 mmHg. Aortic valve peak gradient measures 8.9 mmHg. Aortic valve area, by VTI measures 2.57 cm. Pulmonic Valve: The pulmonic valve was normal in structure. Pulmonic valve regurgitation is not visualized. No evidence of pulmonic stenosis. Aorta: The aortic root is normal in size and structure. Venous: The inferior vena cava is normal in size with greater than 50% respiratory variability, suggesting right atrial pressure of 3 mmHg. IAS/Shunts: No atrial level shunt detected by color flow Doppler.  LEFT VENTRICLE PLAX 2D LVIDd:         3.80 cm   Diastology LVIDs:         2.50 cm   LV e' medial:    6.00 cm/s LV PW:         1.50 cm   LV E/e' medial:  11.0 LV IVS:        1.60 cm   LV e' lateral:   10.20 cm/s LVOT diam:     1.90 cm   LV E/e' lateral: 6.4 LV SV:         90 LV SV Index:   59 LVOT Area:     2.84 cm  RIGHT VENTRICLE RV Basal diam:  2.80 cm RV S prime:     12.60 cm/s TAPSE (M-mode): 1.8 cm LEFT ATRIUM             Index        RIGHT ATRIUM           Index LA diam:        2.40 cm 1.57 cm/m   RA Area:     11.00 cm LA Vol (A2C):   46.3 ml 30.35 ml/m  RA Volume:   21.70 ml  14.22 ml/m LA Vol (A4C):   50.5 ml 33.10 ml/m LA Biplane Vol: 49.1 ml 32.19 ml/m  AORTIC VALVE AV Area (Vmax):    2.68 cm AV Area (Vmean):   2.59 cm AV Area (VTI):     2.57 cm AV Vmax:           149.00 cm/s AV Vmean:          99.800 cm/s AV VTI:            0.350 m AV Peak Grad:      8.9 mmHg AV Mean Grad:      5.0 mmHg LVOT Vmax:         141.00 cm/s LVOT Vmean:        91.100 cm/s LVOT VTI:          0.317 m LVOT/AV VTI ratio: 0.91  AORTA Ao Root diam: 3.00 cm Ao Asc  diam:  3.10 cm MITRAL VALVE MV Area (PHT): 1.94 cm    SHUNTS MV Decel Time: 392 msec    Systemic VTI:  0.32 m MV E velocity: 65.70 cm/s  Systemic Diam: 1.90 cm MV A velocity: 83.70 cm/s MV E/A ratio:  0.78 Glori Bickers MD Electronically signed by Glori Bickers MD Signature Date/Time: 07/14/2021/4:07:36 PM     Final     Microbiology: Results for orders placed or performed during the hospital encounter of 07/12/21  Resp Panel by RT-PCR (Flu A&B, Covid) Urine, Clean Catch     Status: None   Collection Time: 07/12/21  6:15 PM   Specimen: Urine, Clean Catch; Nasopharyngeal(NP) swabs in vial transport medium  Result Value Ref Range Status   SARS Coronavirus 2 by RT PCR NEGATIVE NEGATIVE Final    Comment: (NOTE) SARS-CoV-2 target nucleic acids are NOT DETECTED.  The SARS-CoV-2 RNA is generally detectable in upper respiratory specimens during the acute phase of infection. The lowest concentration of SARS-CoV-2 viral copies this assay can detect is 138 copies/mL. A negative result does not preclude SARS-Cov-2 infection and should not be used as the sole basis for treatment or other patient management decisions. A negative result may occur with  improper specimen collection/handling, submission of specimen other than nasopharyngeal swab, presence of viral mutation(s) within the areas targeted by this assay, and inadequate number of viral copies(<138 copies/mL). A negative result must be combined with clinical observations, patient history, and epidemiological information. The expected result is Negative.  Fact Sheet for Patients:  EntrepreneurPulse.com.au  Fact Sheet for Healthcare Providers:  IncredibleEmployment.be  This test is no t yet approved or cleared by the [place] FDA and  has been authorized for detection and/or diagnosis of SARS-CoV-2 by FDA under an Emergency Use Authorization (EUA). This EUA will remain  in effect (meaning this test can be used) for the duration of the COVID-19 declaration under Section 564(b)(1) of the Act, 21 U.S.C.section 360bbb-3(b)(1), unless the authorization is terminated  or revoked sooner.       Influenza A by PCR NEGATIVE NEGATIVE Final   Influenza B by PCR NEGATIVE NEGATIVE Final    Comment: (NOTE) The  Xpert Xpress SARS-CoV-2/FLU/RSV plus assay is intended as an aid in the diagnosis of influenza from Nasopharyngeal swab specimens and should not be used as a sole basis for treatment. Nasal washings and aspirates are unacceptable for Xpert Xpress SARS-CoV-2/FLU/RSV testing.  Fact Sheet for Patients: EntrepreneurPulse.com.au  Fact Sheet for Healthcare Providers: IncredibleEmployment.be  This test is not yet approved or cleared by the [place] FDA and has been authorized for detection and/or diagnosis of SARS-CoV-2 by FDA under an Emergency Use Authorization (EUA). This EUA will remain in effect (meaning this test can be used) for the duration of the COVID-19 declaration under Section 564(b)(1) of the Act, 21 U.S.C. section 360bbb-3(b)(1), unless the authorization is terminated or revoked.  Performed at [facility], [address] [place], [place] [address]     Labs: CBC: Recent Labs  Lab 07/12/21 1743  WBC 8.4  NEUTROABS 7.4  HGB 13.0  HCT 39.8  MCV 96.8  PLT 035   Basic Metabolic Panel: Recent Labs  Lab 07/12/21 1743  NA 136  K 3.7  CL 104  CO2 23  GLUCOSE 141*  BUN 18  CREATININE 0.90  CALCIUM 9.3   Liver Function Tests: No results for input(s): AST, ALT, ALKPHOS, BILITOT, PROT, ALBUMIN in the last 168 hours. CBG: No results for  input(s): GLUCAP in the last 168 hours.  Discharge time spent: less than 30 minutes.  Signed: Annita Brod, MD [facility] 07/17/2021

## 2021-07-17 NOTE — Care Management Important Message (Signed)
Important Message  Patient Details  Name: Krista Joseph MRN: [id number] Date of Birth: 17-May-1930   Medicare Important Message Given:  Yes     Krista Joseph 07/17/2021, 2:42 PM

## 2021-07-17 NOTE — TOC Benefit Eligibility Note (Signed)
Patient Teacher, English as a foreign language completed.    The patient is currently admitted and upon discharge could be taking Xarelto 15 mg.  The current 30 day co-pay is, $45.00.   The patient is insured through [place], [place] Patient Advocate Specialist [place] Patient Advocate Team Direct Number: [phone]  Fax: [phone]

## 2021-07-17 NOTE — Progress Notes (Signed)
Physical Therapy Treatment Patient Details Name: Krista Joseph MRN: [id number] DOB: 1929-12-26 Today's Date: 07/17/2021   History of Present Illness 88 y/o female admitted on 07/12/20 with N/V and dizziness. Head CT-remote stroke left cerebellum. Brain MRI- Relatively large acute infarct right PICA territory. PMH includes CAD, HTN.    PT Comments    Progressing well.  Family aware that they will have to supervise/assist for safety as needed.  Emphasis on transfer safety, progression of gait stability with the RW and safe negotiation of stairs.    Recommendations for follow up therapy are one component of a multi-disciplinary discharge planning process, led by the attending physician.  Recommendations may be updated based on patient status, additional functional criteria and insurance authorization.  Follow Up Recommendations  Home health PT     Assistance Recommended at Discharge Intermittent Supervision/Assistance  Patient can return home with the following A little help with walking and/or transfers;A little help with bathing/dressing/bathroom;Assistance with cooking/housework;Direct supervision/assist for medications management;Direct supervision/assist for financial management;Assist for transportation;Help with stairs or ramp for entrance   Equipment Recommendations  None recommended by PT    Recommendations for Other Services       Precautions / Restrictions Precautions Precautions: Fall     Mobility  Bed Mobility Overal bed mobility: Needs Assistance Bed Mobility: Supine to Sit, Sit to Supine     Supine to sit: Supervision, HOB elevated Sit to supine: Min guard   General bed mobility comments: min assist to min guard to reposition in bed including cuing for direction.    Transfers Overall transfer level: Needs assistance   Transfers: Sit to/from Stand Sit to Stand: Min guard           General transfer comment: stability safety     Ambulation/Gait Ambulation/Gait assistance: Min guard, Min assist Gait Distance (Feet): 280 Feet Assistive device: Rolling walker (2 wheels) Gait Pattern/deviations: Step-through pattern, Decreased stride length Gait velocity: slower Gait velocity interpretation: <1.8 ft/sec, indicate of risk for recurrent falls   General Gait Details: pt is mildly unsteady overall and needs occasional stability and RW assist due to tends to drift into stationary surfaces.   Stairs Stairs: Yes Stairs assistance: Min guard, Min assist (minimal cues) Stair Management: One rail Right, Step to pattern, Forwards Number of Stairs: 5 General stair comments: safe with min guard and rail   Wheelchair Mobility    Modified Rankin (Stroke Patients Only) Modified Rankin (Stroke Patients Only) Modified Rankin: Moderate disability     Balance Overall balance assessment: Needs assistance   Sitting balance-Leahy Scale: Fair       Standing balance-Leahy Scale: Normal Standing balance comment: Requires external support for walking and dynamic tasks.  Fair statically--at the sink etc.                            Cognition Arousal/Alertness: Awake/alert Behavior During Therapy: WFL for tasks assessed/performed Overall Cognitive Status: History of cognitive impairments - at baseline                                 General Comments: Family aware of level of pt's cognitive deficits        Exercises      General Comments        Pertinent Vitals/Pain Pain Assessment Pain Assessment: Faces Faces Pain Scale: No hurt Pain Intervention(s): Monitored during session    Home Living  Prior Function            PT Goals (current goals can now be found in the care plan section) Acute Rehab PT Goals Patient Stated Goal: to go home PT Goal Formulation: With patient Time For Goal Achievement: 07/27/21 Potential to Achieve Goals:  Fair Progress towards PT goals: Progressing toward goals    Frequency    Min 4X/week      PT Plan Current plan remains appropriate    Co-evaluation              AM-PAC PT "6 Clicks" Mobility   Outcome Measure  Help needed turning from your back to your side while in a flat bed without using bedrails?: A Little Help needed moving from lying on your back to sitting on the side of a flat bed without using bedrails?: A Little Help needed moving to and from a bed to a chair (including a wheelchair)?: A Little Help needed standing up from a chair using your arms (e.g., wheelchair or bedside chair)?: A Little Help needed to walk in hospital room?: A Little Help needed climbing 3-5 steps with a railing? : A Little 6 Click Score: 18    End of Session   Activity Tolerance: Patient tolerated treatment well Patient left: in bed;with call bell/phone within reach;with bed alarm set;with family/visitor present Nurse Communication: Mobility status PT Visit Diagnosis: Other abnormalities of gait and mobility (R26.89);Other symptoms and signs involving the nervous system (R29.898)     Time: 6812-7517 PT Time Calculation (min) (ACUTE ONLY): 24 min  Charges:  $Gait Training: 8-22 mins $Therapeutic Activity: 8-22 mins                     07/17/2021  Ginger Carne., PT Acute Rehabilitation Services [phone]  (pager) [phone]  (office)   [name] 07/17/2021, 12:24 PM

## 2021-07-17 NOTE — Discharge Instructions (Signed)

## 2021-07-20 ENCOUNTER — Other Ambulatory Visit: Payer: Self-pay | Admitting: *Deleted

## 2021-07-20 NOTE — Patient Outreach (Addendum)
Received a red flag Emmi stroke notification for Ms. Lafoe.  ?I have assigned Valente David, RN to call for follow up and determine if there are any Case Management needs.  ?  ?[place]ville Care, CBCS, CMAA ?[place] Management Assistant ?[place] Management ?[phone]  ?

## 2021-07-20 NOTE — Patient Outreach (Signed)
[facility]) Care Management ? ?07/20/2021 ? ?Krista Joseph ?17-Aug-1929 ?[id number] ? ? ?RED ON EMMI ALERT - Stroke ?Day # 1 ?Date: 3/1 ?Red Alert Reason: Problems setting up rehab?  YES ?Scheduled follow up appointment?  NO ? ? ?Outreach attempt #1, successful to daughter.  Identity verified.  This care manager introduced self and stated purpose of call.  [facility] care management services explained.   ? ?She report member is improving.  Member's granddaughter lives with her, children help out when needed.  Per daughter, member is never left alone.  Member mostly independent with ADL's, requires minimal assistance.  They received call from [facility] today for home health services, awaiting on call back to schedule initial home visit.   ? ?Daughter aware that member needs follow up appointments scheduled with PCP and neurology.  She will call them directly to schedule according to her family's schedule to ensure member has transportation.  Contact information for offices provided.  Reviewed medication list, daughter state member is taking all according to instructions.  No issues with ability to afford medications at this time.  Denies any urgent concerns, encouraged to contact this care manager with questions.   ? ?Plan: ?RN CM will send education regarding stroke prevention.  Will follow up within the next 2 weeks. ? ?Valente David, RN, MSN, CCM ?[facility] Care Management  ?Community Care Manager ?[phone] ? ?

## 2021-07-20 NOTE — Patient Instructions (Signed)
Visit Information ? ?Thank you for taking time to visit with me today. Please don't hesitate to contact me if I can be of assistance to you before our next scheduled telephone appointment. ? ?Following are the goals we discussed today:  ?Monitor blood pressure and heart rate daily, recording readings.   ?Schedule follow up with PCP and Neurology. ? ?Our next appointment is by telephone in 2 weeks ? ?The patient verbalized understanding of instructions, educational materials, and care plan provided today and agreed to receive a mailed copy of patient instructions, educational materials, and care plan.  ? ?The patient has been provided with contact information for the care management team and has been advised to call with any health related questions or concerns.  ? ?Valente David, RN, MSN, CCM ?[facility] Care Management  ?Community Care Manager ?[phone] ? ?

## 2021-08-03 ENCOUNTER — Other Ambulatory Visit: Payer: Self-pay | Admitting: *Deleted

## 2021-08-03 NOTE — Patient Outreach (Signed)
[facility]) Care Management ? ?08/03/2021 ? ?Akisha Mance ?Mar 23, 1930 ?[id number] ? ? ?Outgoing call placed to member's daughter to follow up on stroke recovery.  She report member is improving.  State mobility is better and is becoming more independent.  She is concerned about some confusion she has noticed, particularly in the evenings.  Discussed possibility of sundowners as member has history of dementia. She is not on any medications for this but has PCP appointment next week, will discuss at that time.  Also has appointment with neurology on 4/28.  Denies any urgent concerns, encouraged to contact this care manager with questions.  Will close case at this time, no further needs identified.  ? ?Valente David, RN, MSN, CCM ?[facility] Care Management  ?Community Care Manager ?[phone] ? ?

## 2021-09-14 ENCOUNTER — Inpatient Hospital Stay: Payer: Medicare HMO | Admitting: Adult Health

## 2021-09-15 ENCOUNTER — Encounter: Payer: Self-pay | Admitting: Adult Health

## 2021-09-15 ENCOUNTER — Inpatient Hospital Stay: Payer: Medicare HMO | Admitting: Adult Health

## 2021-09-15 ENCOUNTER — Ambulatory Visit: Payer: Medicare HMO | Admitting: Adult Health

## 2021-09-15 VITALS — BP 152/71 | HR 59 | Ht 63.0 in | Wt 123.0 lb

## 2021-09-15 DIAGNOSIS — I69398 Other sequelae of cerebral infarction: Secondary | ICD-10-CM

## 2021-09-15 DIAGNOSIS — R269 Unspecified abnormalities of gait and mobility: Secondary | ICD-10-CM | POA: Diagnosis not present

## 2021-09-15 DIAGNOSIS — F03C0 Unspecified dementia, severe, without behavioral disturbance, psychotic disturbance, mood disturbance, and anxiety: Secondary | ICD-10-CM

## 2021-09-15 DIAGNOSIS — I639 Cerebral infarction, unspecified: Secondary | ICD-10-CM | POA: Diagnosis not present

## 2021-09-15 NOTE — Progress Notes (Signed)
I agree with the above plan 

## 2021-09-15 NOTE — Progress Notes (Signed)
? ? ?PATIENT: Krista Joseph ?DOB: 05-18-1930 ? ?REASON FOR VISIT: follow up ?HISTORY FROM: patient ?PRIMARY NEUROLOGIST: Dr. Leonie Man ? ?HISTORY OF PRESENT ILLNESS: ?Today 09/15/21 ? ?Patient presented to the hospital in February for dizziness and gait ataxia.  MRI did confirm stroke.  Results below ? ?Stroke:  right large cerebellar infarct in PICA distribution likely due to large vessel disease vs. Afib failed eliquis ? ?MRI  Large acute infarct R PICA territory without hemorrhage ?MRA  No large vessel occlusion, poor signal of mid-R PICA indicating mod to severe stenosis. No stenosis in neck. ?Follow-up [facility] 2/24 cytotoxic edema in the right inferior cerebellum in the area of previously noted right PICA territory infarct, with some petechial hemorrhage but no hemorrhagic conversion. Mass effect on the fourth ventricle and 4 mm of right-to-left midline shift in the posterior fossa. ?2D Echo EF 60 to 65% ?LDL 137-on atorvastatin 20 mg however noncompliant while in the hospital increase to 80 mg ?HgbA1c 4.9 ? ?Patient was on aspirin and Eliquis prior to admission due to diagnosis of atrial fibrillation.  While in the hospital Eliquis was held while the patient continued on aspirin.  Since Eliquis failed the patient was switched to Xarelto. patient refused inpatient rehab during hospitalization prefers home health. ? ? ?Therapy ends today. Has noticed improvement.  Daughter reports that she was noticing trouble with her short-term memory prior to hospitalization.  She feels it is gotten worse since then.  She has never been worked up for her memory.  Has 24 hour supervision. Able to complete ADLS. No longer cooks.  No longer calling her siblings like she used to. Repetitive with questions. Sleep well. Good appetite.  ? ?REVIEW OF SYSTEMS: Out of a complete 14 system review of symptoms, the patient complains only of the following symptoms, and all other reviewed systems are negative. ? ?ALLERGIES: ?Allergies  ?Allergen  Reactions  ? Ivp Dye [Iodinated Contrast Media] Itching and Other (See Comments)  ?  Patient states that her blood pressure went up.  ? Latex Itching  ? ? ?HOME MEDICATIONS: ?Outpatient Medications Prior to Visit  ?Medication Sig Dispense Refill  ? aspirin 81 MG tablet Take 81 mg by mouth daily.    ? atorvastatin (LIPITOR) 80 MG tablet Take 1 tablet (80 mg total) by mouth daily. 30 tablet 1  ? furosemide (LASIX) 20 MG tablet Take 20 mg by mouth every other day.    ? Rivaroxaban (XARELTO) 15 MG TABS tablet Take 1 tablet (15 mg total) by mouth daily with supper. 30 tablet 1  ? traMADol (ULTRAM) 50 MG tablet Take 50 mg by mouth every 6 (six) hours as needed for pain.    ? Vitamin D, Ergocalciferol, (DRISDOL) 1.25 MG (50000 UNIT) CAPS capsule Take 50,000 Units by mouth once a week.    ? ?No facility-administered medications prior to visit.  ? ? ?PAST MEDICAL HISTORY: ?Past Medical History:  ?Diagnosis Date  ? Acid reflux   ? Arthritis   ? Bronchitis   ? Coronary artery disease   ? Hypercholesteremia   ? Hyperlipidemia   ? Hypertension   ? ? ?PAST SURGICAL HISTORY: ?Past Surgical History:  ?Procedure Laterality Date  ? ABDOMINAL HYSTERECTOMY    ? VAGINAL HYSTERECTOMY    ? ? ?FAMILY HISTORY: ?No family history on file. ? ?SOCIAL HISTORY: ?Social History  ? ?Socioeconomic History  ? Marital status: Widowed  ?  Spouse name: Not on file  ? Number of children: Not on file  ? Years  of education: Not on file  ? Highest education level: Not on file  ?Occupational History  ? Not on file  ?Tobacco Use  ? Smoking status: Former  ?  Packs/day: 0.25  ?  Years: 1.00  ?  Pack years: 0.25  ?  Types: Cigarettes  ?  Quit date: 09/27/1961  ?  Years since quitting: 60.0  ? Smokeless tobacco: Never  ?Vaping Use  ? Vaping Use: Never used  ?Substance and Sexual Activity  ? Alcohol use: No  ? Drug use: No  ? Sexual activity: Not on file  ?Other Topics Concern  ? Not on file  ?Social History Narrative  ? ** Merged History Encounter **  ?     ? ?Social Determinants of Health  ? ?Financial Resource Strain: Not on file  ?Food Insecurity: Not on file  ?Transportation Needs: Not on file  ?Physical Activity: Not on file  ?Stress: Not on file  ?Social Connections: Not on file  ?Intimate Partner Violence: Not on file  ? ? ? ? ?PHYSICAL EXAM ? ?Vitals:  ? 09/15/21 1048  ?BP: (!) 152/71  ?Pulse: (!) 59  ?Weight: 123 lb (55.8 kg)  ?Height: '5\' 3"'$  (1.6 m)  ? ?Body mass index is 21.79 kg/m?. ? ?Generalized: Well developed, in no acute distress  ? ?Neurological examination  ?Mentation: Alert to person. Follows all commands speech and language fluent ?Cranial nerve II-XII: Pupils were equal round reactive to light. Extraocular movements were full, visual field were full on confrontational test. Facial sensation and strength were normal. Uvula tongue midline. Head turning and shoulder shrug  were normal and symmetric. ?Motor: The motor testing reveals 5 over 5 strength of all 4 extremities. Good symmetric motor tone is noted throughout.  ?Sensory: Sensory testing is intact to soft touch on all 4 extremities. No evidence of extinction is noted.  ?Coordination: Cerebellar testing reveals good finger-nose-finger and heel-to-shin bilaterally.  ?Gait and station: Gait is slightly unsteady and wide-based.  Multiple steps with turns.  Has a walker but did not bring it today.  Tandem gait not attempted. ? ? ?DIAGNOSTIC DATA (LABS, IMAGING, TESTING) ?- I reviewed patient records, labs, notes, testing and imaging myself where available. ? ?Lab Results  ?Component Value Date  ? WBC 8.4 07/12/2021  ? HGB 13.0 07/12/2021  ? HCT 39.8 07/12/2021  ? MCV 96.8 07/12/2021  ? PLT 236 07/12/2021  ? ?   ?Component Value Date/Time  ? NA 136 07/12/2021 1743  ? K 3.7 07/12/2021 1743  ? CL 104 07/12/2021 1743  ? CO2 23 07/12/2021 1743  ? GLUCOSE 141 (H) 07/12/2021 1743  ? BUN 18 07/12/2021 1743  ? CREATININE 0.90 07/12/2021 1743  ? CALCIUM 9.3 07/12/2021 1743  ? PROT 7.0 05/27/2020 1152  ?  ALBUMIN 3.3 (L) 05/27/2020 1152  ? AST 77 (H) 05/27/2020 1152  ? ALT 26 05/27/2020 1152  ? ALKPHOS 84 05/27/2020 1152  ? BILITOT 1.0 05/27/2020 1152  ? GFRNONAA >60 07/12/2021 1743  ? GFRAA 17 (L) 09/28/2017 2012  ? ?Lab Results  ?Component Value Date  ? CHOL 214 (H) 07/13/2021  ? HDL 71 07/13/2021  ? LDLCALC 137 (H) 07/13/2021  ? TRIG 28 07/13/2021  ? CHOLHDL 3.0 07/13/2021  ? ?Lab Results  ?Component Value Date  ? HGBA1C 4.9 07/13/2021  ? ? ? ? ?ASSESSMENT AND PLAN ?88 y.o. year old female  has a past medical history of Acid reflux, Arthritis, Bronchitis, Coronary artery disease, Hypercholesteremia, Hyperlipidemia, and Hypertension. here with: ? ? ?  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 Afib failed eliquis ? ?2.  Dementia ? ?Continue Aspirin 81 mg daily and Xarelto 15 mg daily ?Maintain strict control of the blood pressure with goal less than 130/90-continue amlodipine 5 mg twice a day and Lasix 20 mg qOD-managed by PCP ?Cholesterol LDL goal less than 70-continue atorvastatin 80 mg daily-managed by PCP ?MMSE 9/30 not sure that Aricept nor Namenda would give her much benefit at this point discussed this with the family ?Advised that if she has any additional strokelike symptoms she should go to the emergency room. ?Continue regular follow-ups with PCP/cardiologist ?Follow-up with our office on an as-needed basis ? ? ?Ward Givens, MSN, NP-C 09/15/2021, 10:08 AM ?[facility] ?[address], [address] ?[place], [place] [address] ?[phone] ? ? ?

## 2021-12-10 ENCOUNTER — Encounter (HOSPITAL_BASED_OUTPATIENT_CLINIC_OR_DEPARTMENT_OTHER): Payer: Self-pay | Admitting: Emergency Medicine

## 2021-12-10 ENCOUNTER — Other Ambulatory Visit: Payer: Self-pay

## 2021-12-10 ENCOUNTER — Emergency Department (HOSPITAL_BASED_OUTPATIENT_CLINIC_OR_DEPARTMENT_OTHER)
Admission: EM | Admit: 2021-12-10 | Discharge: 2021-12-10 | Disposition: A | Discharge status: Home / Self Care | Payer: Medicare HMO | Attending: Emergency Medicine | Admitting: Emergency Medicine

## 2021-12-10 DIAGNOSIS — Z7901 Long term (current) use of anticoagulants: Secondary | ICD-10-CM | POA: Diagnosis not present

## 2021-12-10 DIAGNOSIS — Z7982 Long term (current) use of aspirin: Secondary | ICD-10-CM | POA: Insufficient documentation

## 2021-12-10 DIAGNOSIS — Z91040 Latex allergy status: Secondary | ICD-10-CM | POA: Diagnosis not present

## 2021-12-10 DIAGNOSIS — M79605 Pain in left leg: Secondary | ICD-10-CM | POA: Diagnosis present

## 2021-12-10 NOTE — ED Provider Notes (Signed)
[place] EMERGENCY DEPARTMENT Provider Note   CSN: [id number] Arrival date & time: 12/10/21  1242     History  Chief Complaint  Patient presents with   Leg Pain    Krista Joseph is a 88 y.o. female.  88 yo F with a chief complaints of bumps on the anterior aspect of her left lower extremity.  This was noticed yesterday.  The patient thinks that she bumped it while working on her flower garden.  The family does not think that this is true.  They are worried that she might have a clot in her legs.  Brought in for evaluation.  The patient tells me that they are somewhat tender.  She denies any obvious break in the skin there.-   Leg Pain      Home Medications Prior to Admission medications   Medication Sig Start Date End Date Taking? Authorizing Provider  aspirin 81 MG tablet Take 81 mg by mouth daily.    [provider]  atorvastatin (LIPITOR) 80 MG tablet Take 1 tablet (80 mg total) by mouth daily. 07/18/21   Annita Brod, MD  furosemide (LASIX) 20 MG tablet Take 20 mg by mouth every other day.    [provider]  Rivaroxaban (XARELTO) 15 MG TABS tablet Take 1 tablet (15 mg total) by mouth daily with supper. 07/17/21   Annita Brod, MD  traMADol (ULTRAM) 50 MG tablet Take 50 mg by mouth every 6 (six) hours as needed for pain. 07/09/21   [provider]  Vitamin D, Ergocalciferol, (DRISDOL) 1.25 MG (50000 UNIT) CAPS capsule Take 50,000 Units by mouth once a week. 07/09/21   [provider]      Allergies    Ivp dye [iodinated contrast media] and Latex    Review of Systems   Review of Systems  Physical Exam Updated Vital Signs BP (!) 150/54   Pulse (!) 50   Temp 97.9 F (36.6 C)   Resp 18   SpO2 98%  Physical Exam Vitals and nursing note reviewed.  Constitutional:      General: She is not in acute distress.    Appearance: She is well-developed. She is not diaphoretic.  HENT:     Head: Normocephalic and  atraumatic.  Eyes:     Pupils: Pupils are equal, round, and reactive to light.  Cardiovascular:     Rate and Rhythm: Normal rate and regular rhythm.     Heart sounds: No murmur heard.    No friction rub. No gallop.  Pulmonary:     Effort: Pulmonary effort is normal.     Breath sounds: No wheezing or rales.  Abdominal:     General: There is no distension.     Palpations: Abdomen is soft.     Tenderness: There is no abdominal tenderness.  Musculoskeletal:        General: No tenderness.     Cervical back: Normal range of motion and neck supple.     Comments: 2 well-circumscribed lesions to the left anterior shin.  No appreciable erythema they are more hyperpigmented.  Slight fluctuance to the most distal lesion.  Pulse motor and sensation intact distally.  Skin:    General: Skin is warm and dry.  Neurological:     Mental Status: She is alert and oriented to person, place, and time.  Psychiatric:        Behavior: Behavior normal.     ED Results / Procedures / Treatments  Labs (all labs ordered are listed, but only abnormal results are displayed) Labs Reviewed - No data to display  EKG None  Radiology No results found.  Procedures Procedures    Medications Ordered in ED Medications - No data to display  ED Course/ Medical Decision Making/ A&P                           Medical Decision Making  88 yo F with a chief complaints of 2 bumps on the anterior aspect of her left lower leg.  These could be bruises versus early abscesses.  I discussed this with the patient and family.  The family is more concerned about a DVT.  I discussed with them about how this is extremely unlikely based on the presentation.  We will have them follow-up with her family doctor in the office.  1:38 PM:  I have discussed the diagnosis/risks/treatment options with the patient and family.  Evaluation and diagnostic testing in the emergency department does not suggest an emergent condition requiring  admission or immediate intervention beyond what has been performed at this time.  They will follow up with  PCP. We also discussed returning to the ED immediately if new or worsening sx occur. We discussed the sx which are most concerning (e.g., sudden worsening pain, fever, inability to tolerate by mouth, worsening swelling, pain) that necessitate immediate return. Medications administered to the patient during their visit and any new prescriptions provided to the patient are listed below.  Medications given during this visit Medications - No data to display   The patient appears reasonably screen and/or stabilized for discharge and I doubt any other medical condition or other [facility] requiring further screening, evaluation, or treatment in the ED at this time prior to discharge.          Final Clinical Impression(s) / ED Diagnoses Final diagnoses:  Left leg pain    Rx / DC Orders ED Discharge Orders     None         Deno Etienne, DO 12/10/21 1338

## 2021-12-10 NOTE — ED Triage Notes (Signed)
Patient daughter notice 2 swollen areas on patients leg. Unsure  if patient hit leg or not.

## 2021-12-10 NOTE — Discharge Instructions (Signed)
The blood clots we worry about in people's legs are the ones in the big veins that make your whole leg big and swollen.  I suspect most likely have bruising to your leg.  The other possibility would be that you have a skin infection.  If this gets red you have drainage or develop a fever then please follow-up.  Also return if you have worsening leg swelling or difficulty walking.

## 2022-06-24 ENCOUNTER — Other Ambulatory Visit: Payer: Self-pay

## 2022-06-24 ENCOUNTER — Emergency Department (HOSPITAL_BASED_OUTPATIENT_CLINIC_OR_DEPARTMENT_OTHER)
Admission: EM | Admit: 2022-06-24 | Discharge: 2022-06-24 | Disposition: A | Discharge status: Home / Self Care | Payer: Medicare HMO | Attending: Emergency Medicine | Admitting: Emergency Medicine

## 2022-06-24 ENCOUNTER — Encounter (HOSPITAL_BASED_OUTPATIENT_CLINIC_OR_DEPARTMENT_OTHER): Payer: Self-pay | Admitting: Emergency Medicine

## 2022-06-24 ENCOUNTER — Emergency Department (HOSPITAL_BASED_OUTPATIENT_CLINIC_OR_DEPARTMENT_OTHER): Payer: Medicare HMO

## 2022-06-24 DIAGNOSIS — U071 COVID-19: Secondary | ICD-10-CM | POA: Diagnosis not present

## 2022-06-24 DIAGNOSIS — Z7901 Long term (current) use of anticoagulants: Secondary | ICD-10-CM | POA: Insufficient documentation

## 2022-06-24 DIAGNOSIS — Z91040 Latex allergy status: Secondary | ICD-10-CM | POA: Diagnosis not present

## 2022-06-24 DIAGNOSIS — Z7982 Long term (current) use of aspirin: Secondary | ICD-10-CM | POA: Insufficient documentation

## 2022-06-24 DIAGNOSIS — R059 Cough, unspecified: Secondary | ICD-10-CM | POA: Diagnosis present

## 2022-06-24 HISTORY — DX: Unspecified dementia, unspecified severity, without behavioral disturbance, psychotic disturbance, mood disturbance, and anxiety: F03.90

## 2022-06-24 LAB — COMPREHENSIVE METABOLIC PANEL
ALT: 14 U/L (ref 0–44)
AST: 25 U/L (ref 15–41)
Albumin: 3.6 g/dL (ref 3.5–5.0)
Alkaline Phosphatase: 67 U/L (ref 38–126)
Anion gap: 9 (ref 5–15)
BUN: 14 mg/dL (ref 8–23)
CO2: 24 mmol/L (ref 22–32)
Calcium: 9 mg/dL (ref 8.9–10.3)
Chloride: 109 mmol/L (ref 98–111)
Creatinine, Ser: 1.05 mg/dL — ABNORMAL HIGH (ref 0.44–1.00)
GFR, Estimated: 50 mL/min — ABNORMAL LOW (ref >60)
Glucose, Bld: 118 mg/dL — ABNORMAL HIGH (ref 70–99)
Potassium: 3.3 mmol/L — ABNORMAL LOW (ref 3.5–5.1)
Sodium: 142 mmol/L (ref 135–145)
Total Bilirubin: 0.6 mg/dL (ref 0.3–1.2)
Total Protein: 7.8 g/dL (ref 6.5–8.1)

## 2022-06-24 LAB — DIFFERENTIAL
Abs Immature Granulocytes: 0.02 10*3/uL (ref 0.00–0.07)
Basophils Absolute: 0 10*3/uL (ref 0.0–0.1)
Basophils Relative: 1 %
Eosinophils Absolute: 0.1 10*3/uL (ref 0.0–0.5)
Eosinophils Relative: 1 %
Immature Granulocytes: 0 %
Lymphocytes Relative: 22 %
Lymphs Abs: 1.1 10*3/uL (ref 0.7–4.0)
Monocytes Absolute: 0.5 10*3/uL (ref 0.1–1.0)
Monocytes Relative: 10 %
Neutro Abs: 3.4 10*3/uL (ref 1.7–7.7)
Neutrophils Relative %: 66 %

## 2022-06-24 LAB — CBC
HCT: 37.6 % (ref 36.0–46.0)
Hemoglobin: 12.2 g/dL (ref 12.0–15.0)
MCH: 31 pg (ref 26.0–34.0)
MCHC: 32.4 g/dL (ref 30.0–36.0)
MCV: 95.4 fL (ref 80.0–100.0)
Platelets: 213 10*3/uL (ref 150–400)
RBC: 3.94 MIL/uL (ref 3.87–5.11)
RDW: 13.1 % (ref 11.5–15.5)
WBC: 5.2 10*3/uL (ref 4.0–10.5)
nRBC: 0 % (ref 0.0–0.2)

## 2022-06-24 LAB — RESP PANEL BY RT-PCR (RSV, FLU A&B, COVID)  RVPGX2
Influenza A by PCR: NEGATIVE
Influenza B by PCR: NEGATIVE
Resp Syncytial Virus by PCR: NEGATIVE
SARS Coronavirus 2 by RT PCR: POSITIVE — AB

## 2022-06-24 LAB — CBG MONITORING, ED: Glucose-Capillary: 141 mg/dL — ABNORMAL HIGH (ref 70–99)

## 2022-06-24 MED ORDER — SODIUM CHLORIDE 0.9 % IV BOLUS
500.0000 mL | Freq: Once | INTRAVENOUS | Status: AC
  Administered 2022-06-24: 500 mL via INTRAVENOUS

## 2022-06-24 MED ORDER — SODIUM CHLORIDE 0.9% FLUSH
3.0000 mL | Freq: Once | INTRAVENOUS | Status: DC
  Filled 2022-06-24: qty 3

## 2022-06-24 MED ORDER — POTASSIUM CHLORIDE CRYS ER 20 MEQ PO TBCR
20.0000 meq | EXTENDED_RELEASE_TABLET | Freq: Once | ORAL | Status: AC
  Administered 2022-06-24: 20 meq via ORAL
  Filled 2022-06-24: qty 1

## 2022-06-24 NOTE — Discharge Instructions (Signed)
You were seen in the emergency department for cough and feeling bad.  You tested positive for COVID.  Your chest x-ray did not show any signs of pneumonia and your oxygen level was stable.  Unfortunately you are not a candidate for Paxlovid due to your other medications.  You can use cough medication, inhaler along with rest and drink plenty of fluids.  Follow-up with your regular doctor.  Return to the emergency department if any worsening or concerning symptoms.

## 2022-06-24 NOTE — ED Triage Notes (Signed)
Patient c/o cough, flank pain, dizziness, shortness of breath and nausea onset last night.

## 2022-06-24 NOTE — ED Provider Notes (Signed)
[facility] Provider Note   CSN: [id number] Arrival date & time: 06/24/22  1054     History  Chief Complaint  Patient presents with   Cough    Krista Joseph is a 89 y.o. female.  She has had a cough for the last 3 to 4 days worse since last night.  When she woke up this morning she felt dizzy lightheaded.  She said she feels "bad" but is currently not able to describe what that is.  Sounds like she has some pain in her flanks with coughing.  Daughter states she has been eating and drinking well.  No known fever but has had felt cold at times.  No diarrhea nausea vomiting no urinary symptoms.  The history is provided by the patient.  Cough Cough characteristics:  Productive Sputum characteristics:  Nondescript Severity:  Moderate Onset quality:  Gradual Duration:  4 days Timing:  Intermittent Progression:  Worsening Smoker: no   Relieved by:  None tried Worsened by:  Nothing Ineffective treatments:  None tried Associated symptoms: myalgias and shortness of breath   Associated symptoms: no chest pain, no fever and no sore throat        Home Medications Prior to Admission medications   Medication Sig Start Date End Date Taking? Authorizing Provider  aspirin 81 MG tablet Take 81 mg by mouth daily.    [provider]  atorvastatin (LIPITOR) 80 MG tablet Take 1 tablet (80 mg total) by mouth daily. 07/18/21   Annita Brod, MD  furosemide (LASIX) 20 MG tablet Take 20 mg by mouth every other day.    [provider]  Rivaroxaban (XARELTO) 15 MG TABS tablet Take 1 tablet (15 mg total) by mouth daily with supper. 07/17/21   Annita Brod, MD  traMADol (ULTRAM) 50 MG tablet Take 50 mg by mouth every 6 (six) hours as needed for pain. 07/09/21   [provider]  Vitamin D, Ergocalciferol, (DRISDOL) 1.25 MG (50000 UNIT) CAPS capsule Take 50,000 Units by mouth once a week. 07/09/21   [provider]       Allergies    Ivp dye [iodinated contrast media] and Latex    Review of Systems   Review of Systems  Constitutional:  Positive for fatigue. Negative for fever.  HENT:  Negative for sore throat.   Respiratory:  Positive for cough and shortness of breath.   Cardiovascular:  Negative for chest pain.  Gastrointestinal:  Negative for diarrhea and vomiting.  Genitourinary:  Negative for dysuria.  Musculoskeletal:  Positive for myalgias.  Neurological:  Positive for dizziness.    Physical Exam Updated Vital Signs BP (!) 161/60 (BP Location: Right Arm)   Pulse (!) 57   Temp 98.4 F (36.9 C) (Oral)   Resp 18   Ht '5\' 3"'$  (1.6 m)   Wt 62.6 kg   SpO2 98%   BMI 24.45 kg/m  Physical Exam Vitals and nursing note reviewed.  Constitutional:      General: She is not in acute distress.    Appearance: Normal appearance. She is well-developed.  HENT:     Head: Normocephalic and atraumatic.  Eyes:     Conjunctiva/sclera: Conjunctivae normal.  Cardiovascular:     Rate and Rhythm: Regular rhythm. Bradycardia present.     Heart sounds: No murmur heard. Pulmonary:     Effort: Pulmonary effort is normal. No respiratory distress.     Breath sounds: Normal breath sounds.  Abdominal:  Palpations: Abdomen is soft.     Tenderness: There is no abdominal tenderness. There is no guarding or rebound.  Musculoskeletal:        General: No deformity.     Cervical back: Neck supple.     Right lower leg: No edema.     Left lower leg: No edema.  Skin:    General: Skin is warm and dry.     Capillary Refill: Capillary refill takes less than 2 seconds.  Neurological:     General: No focal deficit present.     Mental Status: She is alert.     Sensory: No sensory deficit.     Motor: No weakness.     ED Results / Procedures / Treatments   Labs (all labs ordered are listed, but only abnormal results are displayed) Labs Reviewed  RESP PANEL BY RT-PCR (RSV, FLU A&B, COVID)  RVPGX2 - Abnormal;  Notable for the following components:      Result Value   SARS Coronavirus 2 by RT PCR POSITIVE (*)    All other components within normal limits  COMPREHENSIVE METABOLIC PANEL - Abnormal; Notable for the following components:   Potassium 3.3 (*)    Glucose, Bld 118 (*)    Creatinine, Ser 1.05 (*)    GFR, Estimated 50 (*)    All other components within normal limits  CBG MONITORING, ED - Abnormal; Notable for the following components:   Glucose-Capillary 141 (*)    All other components within normal limits  CBC  DIFFERENTIAL    EKG EKG Interpretation  Date/Time:  Sunday June 24 2022 11:38:37 EST Ventricular Rate:  61 PR Interval:  172 QRS Duration: 99 QT Interval:  431 QTC Calculation: 435 R Axis:   16 Text Interpretation: Sinus rhythm LVH with secondary repolarization abnormality No significant change since prior 1/23 Confirmed by Aletta Edouard [phone]) on 06/24/2022 12:01:51 PM  Radiology DG Chest 2 View  Result Date: 06/24/2022 CLINICAL DATA:  Shortness of breath. EXAM: CHEST - 2 VIEW COMPARISON:  05/27/2020 FINDINGS: Cardiac enlargement and aortic atherosclerosis. No pleural effusion or edema identified. No airspace consolidation. No acute osseous findings identified. IMPRESSION: No active cardiopulmonary abnormalities. Electronically Signed   By: Kerby Moors M.D.   On: 06/24/2022 12:09    Procedures Procedures    Medications Ordered in ED Medications  sodium chloride flush (NS) 0.9 % injection 3 mL (has no administration in time range)  sodium chloride 0.9 % bolus 500 mL (has no administration in time range)    ED Course/ Medical Decision Making/ A&P Clinical Course as of 06/24/22 1702  Sun Jun 24, 2022  1212 Test x-ray interpreted by me as no clear infiltrates.  Awaiting radiology reading. [MB]  1405 Reviewed patient's medications.  She is on blood thinners and this is a contraindication with Paxlovid.  I updated patient and daughter with results of her  testing.  Recommended symptomatic treatment at home and close follow-up with PCP.  Return instructions discussed [MB]    Clinical Course User Index [MB] Hayden Rasmussen, MD                             Medical Decision Making Amount and/or Complexity of Data Reviewed Labs: ordered. Radiology: ordered.  Risk Prescription drug management.   This patient complains of cough and general malaise; this involves Joseph extensive number of treatment Options and is a complaint that carries with it a high risk  of complications and morbidity. The differential includes pneumonia, COVID, flu, dehydration, bronchitis  I ordered, reviewed and interpreted labs, which included CBC normal, chemistries normal other than mildly elevated creatinine and low potassium, COVID-positive flu negative I ordered medication IV fluids oral potassium and reviewed PMP when indicated. I ordered imaging studies which included chest x-ray and I independently    visualized and interpreted imaging which showed no acute findings Additional history obtained from patient's daughter Previous records obtained and reviewed in epic no recent admissions Cardiac monitoring reviewed, normal sinus rhythm Social determinants considered, no significant barriers Critical Interventions: None  After the interventions stated above, I reevaluated the patient and found patient to be oxygenating well in no distress Admission and further testing considered, no indications for admission at this time.  Unfortunately patient has multiple drug interactions as far as starting on Paxlovid.  Recommended symptomatic treatment and close follow-up with PCP.  Return instructions discussed         Final Clinical Impression(s) / ED Diagnoses Final diagnoses:  COVID-19 virus infection    Rx / DC Orders ED Discharge Orders     None         Hayden Rasmussen, MD 06/24/22 1704

## 2022-06-24 NOTE — ED Notes (Signed)
Alert and Oriented, Resp even and non-labored, POX in the mid to high 90's, congested cough noted, pt taking PO fluids well. AVS provided to family member with pt, explained COVID guidelines of 5 day of quarantine, wearing mask, importance of hand washing. Opportunity for questions provided prior to DC to home with family member. Pt assisted to POV via wc.Marland Kitchen

## 2022-06-24 NOTE — ED Notes (Signed)
Urine spec NOT obtained, pt is incontinent of urine at this time.
